# Patient Record
Sex: Male | Born: 1965
Health system: Southern US, Community
[De-identification: ages and names within clinical notes are randomized; demographics above are authoritative.]

## PROBLEM LIST (undated history)

## (undated) DIAGNOSIS — N200 Calculus of kidney: Secondary | ICD-10-CM

## (undated) DIAGNOSIS — Z0000 Encounter for general adult medical examination without abnormal findings: Secondary | ICD-10-CM

## (undated) DIAGNOSIS — Z87442 Personal history of urinary calculi: Secondary | ICD-10-CM

## (undated) HISTORY — DX: Encounter for general adult medical examination without abnormal findings: Z00.00

## (undated) HISTORY — DX: Calculus of kidney: N20.0

---

## 2017-01-11 DIAGNOSIS — L304 Erythema intertrigo: Secondary | ICD-10-CM | POA: Diagnosis not present

## 2017-03-29 HISTORY — PX: VASECTOMY: SHX75

## 2017-04-28 DIAGNOSIS — Z9852 Vasectomy status: Secondary | ICD-10-CM | POA: Insufficient documentation

## 2017-06-03 DIAGNOSIS — M778 Other enthesopathies, not elsewhere classified: Secondary | ICD-10-CM | POA: Diagnosis not present

## 2017-06-03 DIAGNOSIS — M7522 Bicipital tendinitis, left shoulder: Secondary | ICD-10-CM | POA: Diagnosis not present

## 2017-06-03 DIAGNOSIS — M7552 Bursitis of left shoulder: Secondary | ICD-10-CM | POA: Diagnosis not present

## 2017-06-10 DIAGNOSIS — Z48816 Encounter for surgical aftercare following surgery on the genitourinary system: Secondary | ICD-10-CM | POA: Diagnosis not present

## 2017-06-12 DIAGNOSIS — M25512 Pain in left shoulder: Secondary | ICD-10-CM | POA: Diagnosis not present

## 2017-06-19 DIAGNOSIS — M25512 Pain in left shoulder: Secondary | ICD-10-CM | POA: Diagnosis not present

## 2017-06-28 DIAGNOSIS — M25512 Pain in left shoulder: Secondary | ICD-10-CM | POA: Diagnosis not present

## 2017-07-12 DIAGNOSIS — M25512 Pain in left shoulder: Secondary | ICD-10-CM | POA: Diagnosis not present

## 2017-07-29 DIAGNOSIS — M7522 Bicipital tendinitis, left shoulder: Secondary | ICD-10-CM | POA: Diagnosis not present

## 2017-07-29 DIAGNOSIS — M7552 Bursitis of left shoulder: Secondary | ICD-10-CM | POA: Diagnosis not present

## 2017-07-29 DIAGNOSIS — Z0189 Encounter for other specified special examinations: Secondary | ICD-10-CM | POA: Diagnosis not present

## 2017-08-19 DIAGNOSIS — M7522 Bicipital tendinitis, left shoulder: Secondary | ICD-10-CM | POA: Diagnosis not present

## 2017-08-26 DIAGNOSIS — S43432A Superior glenoid labrum lesion of left shoulder, initial encounter: Secondary | ICD-10-CM | POA: Diagnosis not present

## 2017-08-26 DIAGNOSIS — M75112 Incomplete rotator cuff tear or rupture of left shoulder, not specified as traumatic: Secondary | ICD-10-CM | POA: Diagnosis not present

## 2017-09-23 DIAGNOSIS — R319 Hematuria, unspecified: Secondary | ICD-10-CM | POA: Diagnosis not present

## 2017-09-23 DIAGNOSIS — R3915 Urgency of urination: Secondary | ICD-10-CM | POA: Diagnosis not present

## 2017-09-25 ENCOUNTER — Other Ambulatory Visit: Payer: Self-pay

## 2017-10-03 ENCOUNTER — Ambulatory Visit
Admission: RE | Admit: 2017-10-03 | Discharge: 2017-10-03 | Disposition: A | Discharge status: Home / Self Care | Payer: 59 | Source: Ambulatory Visit | Attending: Urology | Admitting: Urology

## 2017-10-03 ENCOUNTER — Ambulatory Visit: Payer: 59 | Admitting: Urology

## 2017-10-03 ENCOUNTER — Encounter: Payer: Self-pay | Admitting: Urology

## 2017-10-03 VITALS — BP 129/87 | HR 62 | Ht 70.0 in | Wt 171.4 lb

## 2017-10-03 DIAGNOSIS — R319 Hematuria, unspecified: Secondary | ICD-10-CM | POA: Diagnosis not present

## 2017-10-03 DIAGNOSIS — R3129 Other microscopic hematuria: Secondary | ICD-10-CM | POA: Insufficient documentation

## 2017-10-03 DIAGNOSIS — N23 Unspecified renal colic: Secondary | ICD-10-CM

## 2017-10-03 MED ORDER — TAMSULOSIN HCL 0.4 MG PO CAPS: 0.4000 mg | ORAL_CAPSULE | Freq: Every day | ORAL | 0 refills | Status: DC

## 2017-10-03 NOTE — Progress Notes (Signed)
10/03/2017 11:51 AM   Meta Hatchet 10-03-1966 [id number]  Referring provider: Luna Glasgow, DO [facility] [place], [place] [address]  Chief Complaint  Patient presents with  . Transferring Care    HPI: 51 y.o. male presents for evaluation of renal colic.  I saw him at [facility] earlier this year for a vasectomy.  He had onset of left flank pain approximately 2 weeks ago.  The first episode lasted only 3-4 minutes then resolved.  He had a second episode which lasted approximately 4 hours.  The pain was nonradiating and isolated to the left flank region.  There were no identifiable precipitating, aggravating or alleviating factors.  Severity was rated moderate.  It was associated with urinary urgency.  He was seen [facility] on 11/26 and urinalysis showed 11-50 RBCs.  He was started empirically on Cipro however his urine culture was negative.  His flank pain has resolved however he continues to have intermittent urgency and voiding small amounts.   PMH: Past Medical History:  Diagnosis Date  . Healthy adult on routine physical examination   . Kidney stone     Surgical History: Past Surgical History:  Procedure Laterality Date  . VASECTOMY  03/2017   Dr. Bernardo Heater    Home Medications:  Allergies as of 10/03/2017   No Known Allergies     Medication List        Accurate as of 10/03/17 11:51 AM. Always use your most recent med list.          DOCOSAHEXAENOIC ACID PO Take 1 capsule by mouth daily.   ibuprofen 400 MG tablet Commonly known as:  ADVIL,MOTRIN Take 400 mg by mouth daily as needed.   meloxicam 15 MG tablet Commonly known as:  MOBIC Take 15 mg by mouth daily as needed for pain.       Allergies: No Known Allergies  Family History: Family History  Problem Relation Age of Onset  . Lung cancer Father   . Other Mother        Pre-diabetic  . Kidney Stones Mother   . Kidney Stones Brother   . Skin cancer  Brother        All has been removed- Not Melanoma    Social History:  reports that  has never smoked. he has never used smokeless tobacco. He reports that he drinks alcohol. He reports that he does not use drugs.  ROS: UROLOGY Frequent Urination?: Yes Hard to postpone urination?: No Burning/pain with urination?: No Get up at night to urinate?: No Leakage of urine?: No Urine stream starts and stops?: No Trouble starting stream?: No Do you have to strain to urinate?: No Blood in urine?: No Urinary tract infection?: No Sexually transmitted disease?: No Injury to kidneys or bladder?: No Painful intercourse?: No Weak stream?: No Erection problems?: No Penile pain?: No Currently pregnant?: No Vaginal bleeding?: No Last menstrual period?: N/A  Gastrointestinal Nausea?: No Vomiting?: No Indigestion/heartburn?: No Diarrhea?: No Constipation?: No  Constitutional Fever: No Night sweats?: No Weight loss?: No Fatigue?: No  Skin Skin rash/lesions?: No Itching?: No  Eyes Blurred vision?: No Double vision?: No  Ears/Nose/Throat Sore throat?: No Sinus problems?: No  Hematologic/Lymphatic Swollen glands?: No Easy bruising?: No  Cardiovascular Leg swelling?: No Chest pain?: No  Respiratory Cough?: No Shortness of breath?: No  Endocrine Excessive thirst?: No  Musculoskeletal Back pain?: No Joint pain?: No  Neurological Headaches?: No Dizziness?: No  Psychologic Depression?: No Anxiety?: No  Physical Exam: BP 129/87   Pulse 62   Ht 5\' 10"  (1.778 m)   Wt 171 lb 6.4 oz (77.7 kg)   BMI 24.59 kg/m   Constitutional:  Alert and oriented, No acute distress. HEENT: [place] AT, moist mucus membranes.  Trachea midline, no masses. Cardiovascular: No clubbing, cyanosis, or edema. Respiratory: Normal respiratory effort, no increased work of breathing. GI: Abdomen is soft, nontender, nondistended, no abdominal masses GU: No CVA tenderness.  Skin: No rashes,  bruises or suspicious lesions. Lymph: No cervical or inguinal adenopathy. Neurologic: Grossly intact, no focal deficits, moving all 4 extremities. Psychiatric: Normal mood and affect.   Assessment & Plan:  Symptoms suspicious for a distal ureteral calculus.  He was sent for KUB to evaluate for an obvious stone.  Start tamsulosin 0.4 mg daily.  If KUB is unremarkable we will schedule a stone protocol CT of the abdomen and pelvis.  He was given a strainer.   1. Microhematuria   2. Renal colic     Abbie Sons, MD  [facility] [address], [place], [place] [address] [phone]

## 2017-10-04 ENCOUNTER — Other Ambulatory Visit: Payer: Self-pay | Admitting: Urology

## 2017-10-04 DIAGNOSIS — R3129 Other microscopic hematuria: Secondary | ICD-10-CM

## 2017-10-04 DIAGNOSIS — R109 Unspecified abdominal pain: Secondary | ICD-10-CM

## 2017-10-09 ENCOUNTER — Telehealth: Payer: Self-pay

## 2017-10-09 NOTE — Telephone Encounter (Signed)
Spoke with pt in reference to KUB results and needing a CT. Pt voiced understanding.

## 2017-10-09 NOTE — Telephone Encounter (Signed)
-----   Message from Abbie Sons, MD sent at 10/04/2017  8:55 AM EST ----- KUB reviewed and there may be a small stone in the left distal ureter and the right kidney however there is a large amount of overlying stool and bowel gas which obscures the images.  Recommend scheduling stone protocol CT.  Order was entered.

## 2017-10-23 ENCOUNTER — Ambulatory Visit
Admission: RE | Admit: 2017-10-23 | Discharge: 2017-10-23 | Disposition: A | Discharge status: Home / Self Care | Payer: 59 | Source: Ambulatory Visit | Attending: Urology | Admitting: Urology

## 2017-10-23 DIAGNOSIS — N201 Calculus of ureter: Secondary | ICD-10-CM | POA: Diagnosis not present

## 2017-10-23 DIAGNOSIS — R3129 Other microscopic hematuria: Secondary | ICD-10-CM | POA: Diagnosis present

## 2017-10-23 DIAGNOSIS — N200 Calculus of kidney: Secondary | ICD-10-CM | POA: Diagnosis not present

## 2017-10-23 DIAGNOSIS — R109 Unspecified abdominal pain: Secondary | ICD-10-CM | POA: Diagnosis not present

## 2017-10-24 ENCOUNTER — Telehealth: Payer: Self-pay

## 2017-10-24 NOTE — Telephone Encounter (Signed)
-----   Message from Abbie Sons, MD sent at 10/24/2017  8:12 AM EST ----- CT does show a 5 mm left distal ureteral calculus.  If he is unable to pass ureteroscopy would be the best treatment option.

## 2017-10-24 NOTE — Telephone Encounter (Signed)
Left pt mess to call 

## 2017-10-24 NOTE — Telephone Encounter (Signed)
Patient notified/SW 

## 2017-11-08 ENCOUNTER — Telehealth: Payer: Self-pay | Admitting: Urology

## 2017-11-08 NOTE — Telephone Encounter (Signed)
Pt needs a refill on Tamsulosin .4 called into [facility] in [place].  Please call pt to confirm [phone]  He is completely out.

## 2017-11-11 ENCOUNTER — Other Ambulatory Visit: Payer: Self-pay | Admitting: Urology

## 2017-11-11 MED ORDER — TAMSULOSIN HCL 0.4 MG PO CAPS: 0.4000 mg | ORAL_CAPSULE | Freq: Every day | ORAL | 0 refills | Status: DC

## 2017-11-11 NOTE — Telephone Encounter (Signed)
Pt called office [facility] stating that he has called office several times asking for refill of his medication.  Has called both pharmacies of which both have told him they have not received anything from our office. Pt states he is out of medication and would like for someone to call hm in a refill and call the patient at [phone]. Please advise.

## 2017-11-15 ENCOUNTER — Telehealth: Payer: Self-pay | Admitting: Urology

## 2017-11-15 MED ORDER — TAMSULOSIN HCL 0.4 MG PO CAPS: 0.4000 mg | ORAL_CAPSULE | Freq: Every day | ORAL | 0 refills | Status: DC

## 2017-11-15 NOTE — Telephone Encounter (Signed)
Medication sent to [facility]

## 2017-11-15 NOTE — Telephone Encounter (Signed)
Pt has been notified  Sharyn Lull

## 2017-11-15 NOTE — Telephone Encounter (Signed)
Patient called and stated that he gave [place] the wrong pharmacy he wants his Tamsulosin called into the [facility] not [facility] Can we please change it and call it in to the correct place?  Sharyn Lull

## 2017-11-30 ENCOUNTER — Encounter (INDEPENDENT_AMBULATORY_CARE_PROVIDER_SITE_OTHER): Payer: Self-pay

## 2017-12-06 ENCOUNTER — Other Ambulatory Visit: Payer: Self-pay | Admitting: Urology

## 2017-12-06 DIAGNOSIS — N201 Calculus of ureter: Secondary | ICD-10-CM

## 2017-12-06 NOTE — Telephone Encounter (Signed)
Made pt follow up appt to see you this coming Monday morning 12/09/2017 @9a , he needs xray orders placed please. He will get xray 8am Monday morning before appt. Please advise. Thank you.

## 2017-12-09 ENCOUNTER — Ambulatory Visit
Admission: RE | Admit: 2017-12-09 | Discharge: 2017-12-09 | Disposition: A | Discharge status: Home / Self Care | Payer: 59 | Source: Ambulatory Visit | Attending: Urology | Admitting: Urology

## 2017-12-09 ENCOUNTER — Encounter: Payer: Self-pay | Admitting: Urology

## 2017-12-09 ENCOUNTER — Ambulatory Visit (INDEPENDENT_AMBULATORY_CARE_PROVIDER_SITE_OTHER): Payer: 59 | Admitting: Urology

## 2017-12-09 VITALS — BP 127/76 | HR 63 | Ht 70.0 in | Wt 167.0 lb

## 2017-12-09 DIAGNOSIS — R935 Abnormal findings on diagnostic imaging of other abdominal regions, including retroperitoneum: Secondary | ICD-10-CM | POA: Diagnosis not present

## 2017-12-09 DIAGNOSIS — N201 Calculus of ureter: Secondary | ICD-10-CM

## 2017-12-09 DIAGNOSIS — N200 Calculus of kidney: Secondary | ICD-10-CM | POA: Diagnosis not present

## 2017-12-09 NOTE — Progress Notes (Signed)
12/09/2017 9:46 AM   Meta Hatchet 1966/09/15 [id number]  Referring provider: Katheren Shams [address] [place], [place] [address]  Chief Complaint  Patient presents with  . Flank Pain    HPI: 52 year old male presents for follow-up of a ureteral calculus.  He was seen on 10/03/2017 with a 2-week history of intermittent left flank pain and urinary frequency and urgency associated with microhematuria.  A stone protocol CT of the abdomen and pelvis was remarkable for a 5 mm nonobstructing left distal ureteral calculus and 4 nonobstructing left renal calculi each measuring approximately 2 mm.  He contacted me last week stating he was having intermittent symptoms and is not aware of passing a stone.  A follow-up visit with KUB was recommended.  KUB performed today was reviewed and the calculus is visualized in the left true bony pelvis consistent with a distal stone.  PMH: Past Medical History:  Diagnosis Date  . Healthy adult on routine physical examination   . Kidney stone     Surgical History: Past Surgical History:  Procedure Laterality Date  . VASECTOMY  03/2017   Dr. Bernardo Heater    Home Medications:  Allergies as of 12/09/2017   No Known Allergies     Medication List        Accurate as of 12/09/17  9:46 AM. Always use your most recent med list.          DOCOSAHEXAENOIC ACID PO Take 1 capsule by mouth daily.   ibuprofen 400 MG tablet Commonly known as:  ADVIL,MOTRIN Take 400 mg by mouth daily as needed.   meloxicam 15 MG tablet Commonly known as:  MOBIC Take 15 mg by mouth daily as needed for pain.   tamsulosin 0.4 MG Caps capsule Commonly known as:  FLOMAX Take 1 capsule (0.4 mg total) by mouth daily.       Allergies: No Known Allergies  Family History: Family History  Problem Relation Age of Onset  . Lung cancer Father   . Other Mother        Pre-diabetic  . Kidney Stones Mother   . Kidney Stones Brother   . Skin cancer  Brother        All has been removed- Not Melanoma    Social History:  reports that  has never smoked. he has never used smokeless tobacco. He reports that he drinks alcohol. He reports that he does not use drugs.  ROS: UROLOGY Frequent Urination?: No Hard to postpone urination?: No Burning/pain with urination?: No Get up at night to urinate?: No Leakage of urine?: No Urine stream starts and stops?: No Trouble starting stream?: No Do you have to strain to urinate?: No Blood in urine?: No Urinary tract infection?: No Sexually transmitted disease?: No Injury to kidneys or bladder?: No Painful intercourse?: No Weak stream?: No Erection problems?: No Penile pain?: No  Gastrointestinal Nausea?: No Vomiting?: No Indigestion/heartburn?: No Diarrhea?: No Constipation?: No  Constitutional Fever: No Night sweats?: No Weight loss?: No Fatigue?: No  Skin Skin rash/lesions?: No Itching?: No  Eyes Blurred vision?: No Double vision?: No  Ears/Nose/Throat Sore throat?: No Sinus problems?: No  Hematologic/Lymphatic Swollen glands?: No Easy bruising?: No  Cardiovascular Leg swelling?: No Chest pain?: No  Respiratory Cough?: No Shortness of breath?: No  Endocrine Excessive thirst?: No  Musculoskeletal Back pain?: No Joint pain?: No  Neurological Headaches?: No Dizziness?: No  Psychologic Depression?: No Anxiety?: No  Physical Exam: BP 127/76   Pulse 63   Ht  5\' 10"  (1.778 m)   Wt 167 lb (75.8 kg)   BMI 23.96 kg/m   Constitutional:  Alert and oriented, No acute distress. HEENT: [place] AT, moist mucus membranes.  Trachea midline, no masses. Cardiovascular: No clubbing, cyanosis, or edema.  CV RRR Respiratory: Normal respiratory effort, no increased work of breathing.  Lungs clear GI: Abdomen is soft, nontender, nondistended, no abdominal masses GU: No CVA tenderness.  Skin: No rashes, bruises or suspicious lesions. Lymph: No cervical or inguinal  adenopathy. Neurologic: Grossly intact, no focal deficits, moving all 4 extremities. Psychiatric: Normal mood and affect.  Laboratory Data:  Pertinent Imaging: Images were personally reviewed  Results for orders placed during the hospital encounter of 12/09/17  Abdomen 1 view (KUB)   Narrative CLINICAL DATA:  Follow-up kidney stones  EXAM: ABDOMEN - 1 VIEW  COMPARISON:  CT urogram of October 23, 2017 and KUB of October 03, 2017.  FINDINGS: No abnormal calcifications project over either kidney. There are calcifications in the left aspect of the true bony pelvis which likely reflect phleboliths. A approximately 2 x 5 mm calcification projects in the region of the left UVJ and could reflect a recurrent or residual stone. The lung bases are clear. The bony structures are unremarkable.  IMPRESSION: Possible left UVJ stone.  No stones observed elsewhere.   Electronically Signed   By: David  Martinique M.D.   On: 12/09/2017 08:31     Results for orders placed during the hospital encounter of 10/23/17  CT RENAL STONE STUDY   Narrative CLINICAL DATA:  Left flank pain for the last several weeks intermittently. Microhematuria  EXAM: CT ABDOMEN AND PELVIS WITHOUT CONTRAST  TECHNIQUE: Multidetector CT imaging of the abdomen and pelvis was performed following the standard protocol without IV contrast.  COMPARISON:  Radiographs from 10/03/2017  FINDINGS: Lower chest: Unremarkable  Hepatobiliary: Contracted gallbladder. 4 mm hypodense lesion in the left hepatic lobe on image 15/2.  Pancreas: Unremarkable  Spleen: Unremarkable  Adrenals/Urinary Tract: Adrenal glands normal. Non rotated right kidney with suspicion for at least partially duplicated collecting system. I do not see a right-sided renal calculus. However, there is a 5 mm in long axis left distal ureteral calculus without associated significant hydronephrosis or hydroureter. In addition, there are proximally 4  tiny nonobstructive left renal calculi each about 2 mm in diameter.  Stomach/Bowel: Unremarkable  Vascular/Lymphatic: Unremarkable  Reproductive: Unremarkable  Other: No supplemental non-categorized findings.  Musculoskeletal: Fatty left spermatic cord.  IMPRESSION: 1. 5 mm in long axis currently nonobstructive left distal ureteral calculus just proximal to the left UVJ. 2. There are approximately 4 tiny nonobstructive additional left renal calculi. 3. Non rotated right kidney with suspicion for at least partially duplicated right renal collecting system.   Electronically Signed   By: Van Clines M.D.   On: 10/23/2017 16:58     Assessment & Plan:   52 year old male with a 5 mm left distal ureteral calculus.  Although minimally symptomatic he has been unable to pass the stone and was informed it is unlikely will pass spontaneously since it is been over 2 months.  Management options were discussed including ureteroscopic removal and shockwave lithotripsy.  The pros and cons of each treatment were discussed.  He would like to think over these options and states he will call back with his decision.  1. Left ureteral calculus   Abbie Sons, MD  [facility] [address], [place], [place] [address] [phone]

## 2017-12-09 NOTE — H&P (View-Only) (Signed)
12/09/2017 9:46 AM   Edwin Day [birth year] [id number]  Referring provider: Katheren Shams [address] [place], [place] [address]  Chief Complaint  Patient presents with  . Flank Pain    HPI: 52 year old male presents for follow-up of a ureteral calculus.  He was seen on 10/03/2017 with a 2-week history of intermittent left flank pain and urinary frequency and urgency associated with microhematuria.  A stone protocol CT of the abdomen and pelvis was remarkable for a 5 mm nonobstructing left distal ureteral calculus and 4 nonobstructing left renal calculi each measuring approximately 2 mm.  He contacted me last week stating he was having intermittent symptoms and is not aware of passing a stone.  A follow-up visit with KUB was recommended.  KUB performed today was reviewed and the calculus is visualized in the left true bony pelvis consistent with a distal stone.  PMH: Past Medical History:  Diagnosis Date  . Healthy adult on routine physical examination   . Kidney stone     Surgical History: Past Surgical History:  Procedure Laterality Date  . VASECTOMY  03/2017   Dr. Bernardo Heater    Home Medications:  Allergies as of 12/09/2017   No Known Allergies     Medication List        Accurate as of 12/09/17  9:46 AM. Always use your most recent med list.          DOCOSAHEXAENOIC ACID PO Take 1 capsule by mouth daily.   ibuprofen 400 MG tablet Commonly known as:  ADVIL,MOTRIN Take 400 mg by mouth daily as needed.   meloxicam 15 MG tablet Commonly known as:  MOBIC Take 15 mg by mouth daily as needed for pain.   tamsulosin 0.4 MG Caps capsule Commonly known as:  FLOMAX Take 1 capsule (0.4 mg total) by mouth daily.       Allergies: No Known Allergies  Family History: Family History  Problem Relation Age of Onset  . Lung cancer Father   . Other Mother        Pre-diabetic  . Kidney Stones Mother   . Kidney Stones Brother   . Skin cancer  Brother        All has been removed- Not Melanoma    Social History:  reports that  has never smoked. he has never used smokeless tobacco. He reports that he drinks alcohol. He reports that he does not use drugs.  ROS: UROLOGY Frequent Urination?: No Hard to postpone urination?: No Burning/pain with urination?: No Get up at night to urinate?: No Leakage of urine?: No Urine stream starts and stops?: No Trouble starting stream?: No Do you have to strain to urinate?: No Blood in urine?: No Urinary tract infection?: No Sexually transmitted disease?: No Injury to kidneys or bladder?: No Painful intercourse?: No Weak stream?: No Erection problems?: No Penile pain?: No  Gastrointestinal Nausea?: No Vomiting?: No Indigestion/heartburn?: No Diarrhea?: No Constipation?: No  Constitutional Fever: No Night sweats?: No Weight loss?: No Fatigue?: No  Skin Skin rash/lesions?: No Itching?: No  Eyes Blurred vision?: No Double vision?: No  Ears/Nose/Throat Sore throat?: No Sinus problems?: No  Hematologic/Lymphatic Swollen glands?: No Easy bruising?: No  Cardiovascular Leg swelling?: No Chest pain?: No  Respiratory Cough?: No Shortness of breath?: No  Endocrine Excessive thirst?: No  Musculoskeletal Back pain?: No Joint pain?: No  Neurological Headaches?: No Dizziness?: No  Psychologic Depression?: No Anxiety?: No  Physical Exam: BP 127/76   Pulse 63   Ht  5\' 10"  (1.778 m)   Wt 167 lb (75.8 kg)   BMI 23.96 kg/m   Constitutional:  Alert and oriented, No acute distress. HEENT: [place] AT, moist mucus membranes.  Trachea midline, no masses. Cardiovascular: No clubbing, cyanosis, or edema.  CV RRR Respiratory: Normal respiratory effort, no increased work of breathing.  Lungs clear GI: Abdomen is soft, nontender, nondistended, no abdominal masses GU: No CVA tenderness.  Skin: No rashes, bruises or suspicious lesions. Lymph: No cervical or inguinal  adenopathy. Neurologic: Grossly intact, no focal deficits, moving all 4 extremities. Psychiatric: Normal mood and affect.  Laboratory Data:  Pertinent Imaging: Images were personally reviewed  Results for orders placed during the hospital encounter of 12/09/17  Abdomen 1 view (KUB)   Narrative CLINICAL DATA:  Follow-up kidney stones  EXAM: ABDOMEN - 1 VIEW  COMPARISON:  CT urogram of October 23, 2017 and KUB of October 03, 2017.  FINDINGS: No abnormal calcifications project over either kidney. There are calcifications in the left aspect of the true bony pelvis which likely reflect phleboliths. A approximately 2 x 5 mm calcification projects in the region of the left UVJ and could reflect a recurrent or residual stone. The lung bases are clear. The bony structures are unremarkable.  IMPRESSION: Possible left UVJ stone.  No stones observed elsewhere.   Electronically Signed   By: David  Martinique M.D.   On: 12/09/2017 08:31     Results for orders placed during the hospital encounter of 10/23/17  CT RENAL STONE STUDY   Narrative CLINICAL DATA:  Left flank pain for the last several weeks intermittently. Microhematuria  EXAM: CT ABDOMEN AND PELVIS WITHOUT CONTRAST  TECHNIQUE: Multidetector CT imaging of the abdomen and pelvis was performed following the standard protocol without IV contrast.  COMPARISON:  Radiographs from 10/03/2017  FINDINGS: Lower chest: Unremarkable  Hepatobiliary: Contracted gallbladder. 4 mm hypodense lesion in the left hepatic lobe on image 15/2.  Pancreas: Unremarkable  Spleen: Unremarkable  Adrenals/Urinary Tract: Adrenal glands normal. Non rotated right kidney with suspicion for at least partially duplicated collecting system. I do not see a right-sided renal calculus. However, there is a 5 mm in long axis left distal ureteral calculus without associated significant hydronephrosis or hydroureter. In addition, there are proximally 4  tiny nonobstructive left renal calculi each about 2 mm in diameter.  Stomach/Bowel: Unremarkable  Vascular/Lymphatic: Unremarkable  Reproductive: Unremarkable  Other: No supplemental non-categorized findings.  Musculoskeletal: Fatty left spermatic cord.  IMPRESSION: 1. 5 mm in long axis currently nonobstructive left distal ureteral calculus just proximal to the left UVJ. 2. There are approximately 4 tiny nonobstructive additional left renal calculi. 3. Non rotated right kidney with suspicion for at least partially duplicated right renal collecting system.   Electronically Signed   By: Van Clines M.D.   On: 10/23/2017 16:58     Assessment & Plan:   52 year old male with a 5 mm left distal ureteral calculus.  Although minimally symptomatic he has been unable to pass the stone and was informed it is unlikely will pass spontaneously since it is been over 2 months.  Management options were discussed including ureteroscopic removal and shockwave lithotripsy.  The pros and cons of each treatment were discussed.  He would like to think over these options and states he will call back with his decision.  1. Left ureteral calculus   Abbie Sons, MD  [facility] [address], [place],  [address] [phone]

## 2017-12-10 ENCOUNTER — Encounter: Payer: Self-pay | Admitting: Urology

## 2017-12-17 ENCOUNTER — Telehealth: Payer: Self-pay | Admitting: Radiology

## 2017-12-17 ENCOUNTER — Encounter (INDEPENDENT_AMBULATORY_CARE_PROVIDER_SITE_OTHER): Payer: Self-pay

## 2017-12-17 NOTE — Telephone Encounter (Signed)
Pt was seen on 12/09/2017 & states he would like to proceed with URS with Dr Bernardo Heater. Please advise.

## 2017-12-18 ENCOUNTER — Telehealth: Payer: Self-pay

## 2017-12-18 NOTE — Telephone Encounter (Signed)
Pt has NKDA. Which abx for surgery?

## 2017-12-18 NOTE — Telephone Encounter (Signed)
Can go ahead and schedule left URS with holmium and stent placement.  Pre op urine cx.

## 2017-12-18 NOTE — Telephone Encounter (Signed)
Pt sent a mychart message voicing concern about the other kidney stones he has. Pt inquired about what should be done about them. Pt is scheduled for surgery in early March for surgery.

## 2017-12-22 NOTE — Telephone Encounter (Signed)
Ancef 2 g

## 2017-12-22 NOTE — Telephone Encounter (Signed)
I can attempt to remove these at the time of his ureteroscopy however there would be a higher complication rate and he would need a stent for a longer period of time.  These are all very small and should easily pass if they have removed.  Can discuss this with him further if desired.

## 2017-12-23 ENCOUNTER — Other Ambulatory Visit: Payer: Self-pay | Admitting: Radiology

## 2017-12-23 DIAGNOSIS — N201 Calculus of ureter: Secondary | ICD-10-CM

## 2017-12-23 NOTE — Telephone Encounter (Signed)
Called to notify pt of surgery date, pre-admit testing appt & f/u appt with Dr Bernardo Heater. Advised pt of Dr Dene Gentry message below regarding other kidney stones. Pt prefers to wait until f/u appt to discuss this further. Instructions given. Pt voices understanding with no further questions at this time.

## 2017-12-25 ENCOUNTER — Ambulatory Visit: Payer: 59 | Admitting: Urology

## 2017-12-27 ENCOUNTER — Encounter (INDEPENDENT_AMBULATORY_CARE_PROVIDER_SITE_OTHER): Payer: Self-pay

## 2017-12-27 ENCOUNTER — Encounter
Admission: RE | Admit: 2017-12-27 | Discharge: 2017-12-27 | Disposition: A | Discharge status: Home / Self Care | Payer: 59 | Source: Ambulatory Visit | Attending: Urology | Admitting: Urology

## 2017-12-27 ENCOUNTER — Other Ambulatory Visit: Payer: Self-pay

## 2017-12-27 DIAGNOSIS — Z01812 Encounter for preprocedural laboratory examination: Secondary | ICD-10-CM | POA: Diagnosis present

## 2017-12-27 HISTORY — DX: Personal history of urinary calculi: Z87.442

## 2017-12-27 NOTE — Patient Instructions (Signed)
Your procedure is scheduled on:  Friday 01/03/18 Report to [place]. To find out your arrival time please call [phone] between 1PM - 3PM on Thursday 01/02/18.  Remember: Instructions that are not followed completely may result in serious medical risk, up to and including death, or upon the discretion of your surgeon and anesthesiologist your surgery may need to be rescheduled.     _X__ 1. Do not eat food after midnight the night before your procedure.                 No gum chewing or hard candies. You may drink clear liquids up to 2 hours                 before you are scheduled to arrive for your surgery- DO not drink clear                 liquids within 2 hours of the start of your surgery.                 Clear Liquids include:  water, apple juice without pulp, clear carbohydrate                 drink such as Clearfast or Gatorade, Black Coffee or Tea (Do not add                 anything to coffee or tea).  __X__2.  On the morning of surgery brush your teeth with toothpaste and water, you                 may rinse your mouth with mouthwash if you wish.  Do not swallow any              toothpaste of mouthwash.     _X__ 3.  No Alcohol for 24 hours before or after surgery.   _X__ 4.  Do Not Smoke or use e-cigarettes For 24 Hours Prior to Your Surgery.                 Do not use any chewable tobacco products for at least 6 hours prior to                 surgery.  ____  5.  Bring all medications with you on the day of surgery if instructed.   __X__  6.  Notify your doctor if there is any change in your medical condition      (cold, fever, infections).     Do not wear jewelry, make-up, hairpins, clips or nail polish. Do not wear lotions, powders, or perfumes.  Do not shave 48 hours prior to surgery. Men may shave face and neck. Do not bring valuables to the hospital.    [facility] is not responsible for any belongings or  valuables.  Contacts, dentures/partials or body piercings may not be worn into surgery. Bring a case for your contacts, glasses or hearing aids, a denture cup will be supplied. Leave your suitcase in the car. After surgery it may be brought to your room. For patients admitted to the hospital, discharge time is determined by your treatment team.   Patients discharged the day of surgery will not be allowed to drive home.   Please read over the following fact sheets that you were given:   MRSA Information  __X__ Take these medicines the morning of surgery with A SIP OF WATER:  1. none  2.   3.   4.  5.  6.  ____ Fleet Enema (as directed)   ____ Use CHG Soap/SAGE wipes as directed  ____ Use inhalers on the day of surgery  ____ Stop metformin/Janumet/Farxiga 2 days prior to surgery    ____ Take 1/2 of usual insulin dose the night before surgery. No insulin the morning          of surgery.   ____ Stop Blood Thinners Coumadin/Plavix/Xarelto/Pleta/Pradaxa/Eliquis/Effient/Aspirin  on    Or contact your Surgeon, Cardiologist or Medical Doctor regarding  ability to stop your blood thinners  __X__ Stop Anti-inflammatories 7 days before surgery such as Advil, Ibuprofen, Motrin,  BC or Goodies Powder, Naprosyn, Naproxen, Aleve, Aspirin  YOU MAY TAKE TYLENOL/ACETAMINOPHEN   __X__ Stopall herbal supplements, fish oil or vitamin E until after surgery.    ____ Bring C-Pap to the hospital.

## 2017-12-28 LAB — URINE CULTURE

## 2018-01-02 MED ORDER — CEFAZOLIN SODIUM-DEXTROSE 2-4 GM/100ML-% IV SOLN
2.0000 g | INTRAVENOUS | Status: AC
  Administered 2018-01-03: 2 g via INTRAVENOUS

## 2018-01-03 ENCOUNTER — Encounter
Admission: RE | Disposition: A | Discharge status: Home / Self Care | Payer: Self-pay | Source: Ambulatory Visit | Attending: Urology

## 2018-01-03 ENCOUNTER — Ambulatory Visit
Admission: RE | Admit: 2018-01-03 | Discharge: 2018-01-03 | Disposition: A | Discharge status: Home / Self Care | Payer: 59 | Source: Ambulatory Visit | Attending: Urology | Admitting: Urology

## 2018-01-03 ENCOUNTER — Encounter: Payer: Self-pay | Admitting: *Deleted

## 2018-01-03 ENCOUNTER — Ambulatory Visit: Payer: 59 | Admitting: Anesthesiology

## 2018-01-03 ENCOUNTER — Other Ambulatory Visit: Payer: Self-pay

## 2018-01-03 DIAGNOSIS — Z87442 Personal history of urinary calculi: Secondary | ICD-10-CM | POA: Diagnosis not present

## 2018-01-03 DIAGNOSIS — N201 Calculus of ureter: Secondary | ICD-10-CM | POA: Insufficient documentation

## 2018-01-03 HISTORY — PX: STONE EXTRACTION WITH BASKET: SHX5318

## 2018-01-03 HISTORY — PX: CYSTOSCOPY/URETEROSCOPY/HOLMIUM LASER/STENT PLACEMENT: SHX6546

## 2018-01-03 SURGERY — CYSTOSCOPY/URETEROSCOPY/HOLMIUM LASER/STENT PLACEMENT
Anesthesia: General | Laterality: Left | Wound class: Clean Contaminated

## 2018-01-03 MED ORDER — DEXAMETHASONE SODIUM PHOSPHATE 10 MG/ML IJ SOLN
INTRAMUSCULAR | Status: DC | PRN
  Administered 2018-01-03: 10 mg via INTRAVENOUS

## 2018-01-03 MED ORDER — HYDROCODONE-ACETAMINOPHEN 5-325 MG PO TABS
1.0000 | ORAL_TABLET | ORAL | Status: DC | PRN
  Administered 2018-01-03: 1 via ORAL

## 2018-01-03 MED ORDER — HYDROCODONE-ACETAMINOPHEN 5-325 MG PO TABS: 1.0000 | ORAL_TABLET | ORAL | 0 refills | Status: DC | PRN

## 2018-01-03 MED ORDER — FENTANYL CITRATE (PF) 100 MCG/2ML IJ SOLN
INTRAMUSCULAR | Status: DC | PRN
  Administered 2018-01-03 (×2): 25 ug via INTRAVENOUS
  Administered 2018-01-03: 50 ug via INTRAVENOUS

## 2018-01-03 MED ORDER — HYDROCODONE-ACETAMINOPHEN 5-325 MG PO TABS
ORAL_TABLET | ORAL | Status: AC
  Filled 2018-01-03: qty 1

## 2018-01-03 MED ORDER — FAMOTIDINE 20 MG PO TABS
ORAL_TABLET | ORAL | Status: AC
  Administered 2018-01-03: 20 mg via ORAL
  Filled 2018-01-03: qty 1

## 2018-01-03 MED ORDER — MIDAZOLAM HCL 2 MG/2ML IJ SOLN
INTRAMUSCULAR | Status: AC
Start: 2018-01-03 — End: ?
  Filled 2018-01-03: qty 2

## 2018-01-03 MED ORDER — MIDAZOLAM HCL 2 MG/2ML IJ SOLN
INTRAMUSCULAR | Status: DC | PRN
  Administered 2018-01-03: 2 mg via INTRAVENOUS

## 2018-01-03 MED ORDER — ONDANSETRON HCL 4 MG/2ML IJ SOLN: 4.0000 mg | Freq: Once | INTRAMUSCULAR | Status: DC | PRN

## 2018-01-03 MED ORDER — PHENYLEPHRINE HCL 10 MG/ML IJ SOLN
INTRAMUSCULAR | Status: DC | PRN
  Administered 2018-01-03: 100 ug via INTRAVENOUS

## 2018-01-03 MED ORDER — IOTHALAMATE MEGLUMINE 43 % IV SOLN
INTRAVENOUS | Status: DC | PRN
  Administered 2018-01-03: 15 mL

## 2018-01-03 MED ORDER — CEFAZOLIN SODIUM-DEXTROSE 2-4 GM/100ML-% IV SOLN
INTRAVENOUS | Status: AC
  Filled 2018-01-03: qty 100

## 2018-01-03 MED ORDER — FENTANYL CITRATE (PF) 100 MCG/2ML IJ SOLN: 25.0000 ug | INTRAMUSCULAR | Status: DC | PRN

## 2018-01-03 MED ORDER — ONDANSETRON HCL 4 MG/2ML IJ SOLN
INTRAMUSCULAR | Status: DC | PRN
  Administered 2018-01-03: 4 mg via INTRAVENOUS

## 2018-01-03 MED ORDER — LACTATED RINGERS IV SOLN
INTRAVENOUS | Status: DC
  Administered 2018-01-03: 09:00:00 via INTRAVENOUS

## 2018-01-03 MED ORDER — TAMSULOSIN HCL 0.4 MG PO CAPS: 0.4000 mg | ORAL_CAPSULE | Freq: Every day | ORAL | 0 refills | Status: AC

## 2018-01-03 MED ORDER — FAMOTIDINE 20 MG PO TABS
20.0000 mg | ORAL_TABLET | Freq: Once | ORAL | Status: AC
  Administered 2018-01-03: 20 mg via ORAL

## 2018-01-03 MED ORDER — FENTANYL CITRATE (PF) 100 MCG/2ML IJ SOLN
INTRAMUSCULAR | Status: AC
  Filled 2018-01-03: qty 2

## 2018-01-03 SURGICAL SUPPLY — 28 items
BAG DRAIN CYSTO-URO LG1000N (MISCELLANEOUS) ×3 IMPLANT
BASKET ZERO TIP 1.9FR (BASKET) ×3 IMPLANT
BRUSH SCRUB EZ 1% IODOPHOR (MISCELLANEOUS) ×3 IMPLANT
CATH URETL 5X70 OPEN END (CATHETERS) ×3 IMPLANT
CNTNR SPEC 2.5X3XGRAD LEK (MISCELLANEOUS) ×1
CONRAY 43 FOR UROLOGY 50M (MISCELLANEOUS) ×3 IMPLANT
CONT SPEC 4OZ STER OR WHT (MISCELLANEOUS) ×2
CONTAINER SPEC 2.5X3XGRAD LEK (MISCELLANEOUS) ×1 IMPLANT
DRAPE UTILITY 15X26 TOWEL STRL (DRAPES) ×3 IMPLANT
FIBER LASER LITHO 273 (Laser) IMPLANT
GLOVE BIO SURGEON STRL SZ8 (GLOVE) ×3 IMPLANT
GOWN STRL REUS W/ TWL LRG LVL3 (GOWN DISPOSABLE) ×2 IMPLANT
GOWN STRL REUS W/TWL LRG LVL3 (GOWN DISPOSABLE) ×4
GUIDEWIRE GREEN .038 145CM (MISCELLANEOUS) IMPLANT
INFUSOR MANOMETER BAG 3000ML (MISCELLANEOUS) ×3 IMPLANT
INTRODUCER DILATOR DOUBLE (INTRODUCER) IMPLANT
KIT TURNOVER CYSTO (KITS) ×3 IMPLANT
PACK CYSTO AR (MISCELLANEOUS) ×3 IMPLANT
SENSORWIRE 0.038 NOT ANGLED (WIRE) ×6
SET CYSTO W/LG BORE CLAMP LF (SET/KITS/TRAYS/PACK) ×3 IMPLANT
SHEATH URETERAL 12FRX35CM (MISCELLANEOUS) IMPLANT
SOL .9 NS 3000ML IRR  AL (IV SOLUTION) ×2
SOL .9 NS 3000ML IRR UROMATIC (IV SOLUTION) ×1 IMPLANT
STENT URET 6FRX24 CONTOUR (STENTS) IMPLANT
STENT URET 6FRX26 CONTOUR (STENTS) IMPLANT
SURGILUBE 2OZ TUBE FLIPTOP (MISCELLANEOUS) ×3 IMPLANT
WATER STERILE IRR 1000ML POUR (IV SOLUTION) ×3 IMPLANT
WIRE SENSOR 0.038 NOT ANGLED (WIRE) ×2 IMPLANT

## 2018-01-03 NOTE — Anesthesia Postprocedure Evaluation (Signed)
Anesthesia Post Note  Patient: Edwin Day  Procedure(s) Performed: CYSTOSCOPY/URETEROSCOPY (Left ) STONE EXTRACTION WITH BASKET (Left )  Patient location during evaluation: PACU Anesthesia Type: General Level of consciousness: awake and alert and oriented Pain management: pain level controlled Vital Signs Assessment: post-procedure vital signs reviewed and stable Respiratory status: spontaneous breathing Cardiovascular status: blood pressure returned to baseline Anesthetic complications: no     Last Vitals:  Vitals:   01/03/18 1050 01/03/18 1115  BP: 140/85 (!) 143/88  Pulse: (!) 55 (!) 57  Resp: 16   Temp: (!) 35.9 C   SpO2: 100%     Last Pain:  Vitals:   01/03/18 0754  TempSrc: Tympanic                 Thomasina Housley

## 2018-01-03 NOTE — Anesthesia Post-op Follow-up Note (Signed)
Anesthesia QCDR form completed.        

## 2018-01-03 NOTE — Transfer of Care (Signed)
Immediate Anesthesia Transfer of Care Note  Patient: Edwin Day  Procedure(s) Performed: CYSTOSCOPY/URETEROSCOPY (Left ) STONE EXTRACTION WITH BASKET (Left )  Patient Location: PACU  Anesthesia Type:General  Level of Consciousness: awake  Airway & Oxygen Therapy: Patient Spontanous Breathing  Post-op Assessment: Report given to RN  Post vital signs: stable  Last Vitals:  Vitals:   01/03/18 0754  BP: 132/85  Pulse: 63  Resp: 18  Temp: (!) 35.7 C  SpO2: 100%    Last Pain:  Vitals:   01/03/18 0754  TempSrc: Tympanic         Complications: No apparent anesthesia complications

## 2018-01-03 NOTE — Interval H&P Note (Signed)
History and Physical Interval Note:  01/03/2018 8:55 AM  Lyndee Hensen  has presented today for surgery, with the diagnosis of left ureteral calculus  The various methods of treatment have been discussed with the patient and family. After consideration of risks, benefits and other options for treatment, the patient has consented to  Procedure(s): CYSTOSCOPY/URETEROSCOPY/HOLMIUM LASER/STENT PLACEMENT (Left) as a surgical intervention .  The patient's history has been reviewed, patient examined, no change in status, stable for surgery.  I have reviewed the patient's chart and labs.  Questions were answered to the patient's satisfaction.     [place]

## 2018-01-03 NOTE — Op Note (Signed)
Preoperative diagnosis: Left distal ureteral calculus  Postoperative diagnosis: Left distal ureteral calculus  Procedure:  1. Cystoscopy 2. Left ureteroscopy and stone removal 3. Left retrograde pyelography with interpretation  Surgeon: Nicki Reaper C. Miyeko Mahlum, M.D.  Anesthesia: General  Complications: None  Intraoperative findings:  1.  Left retrograde pyelography post procedure showed no filling defects, stone fragments or contrast extravasation.  Without a guidewire there was excellent drainage of contrast noted on fluoroscopy.  EBL: Minimal  Specimens: 1. Calculus fragments for analysis   Indication: Edwin Day is a 52 y.o. year old patient with a 5 mm left distal ureteral calculus with intermittent frequency, urgency and flank pain.  The stone was noted in December 2018 and after discussing treatment options he has elected ureteroscopic removal.  After reviewing the management options for treatment, the patient elected to proceed with the above surgical procedure(s). We have discussed the potential benefits and risks of the procedure, side effects of the proposed treatment, the likelihood of the patient achieving the goals of the procedure, and any potential problems that might occur during the procedure or recuperation. Informed consent has been obtained.  Description of procedure:  The patient was taken to the operating room and general anesthesia was induced.  The patient was placed in the dorsal lithotomy position, prepped and draped in the usual sterile fashion, and preoperative antibiotics were administered. A preoperative time-out was performed.   A 22 French cystoscope was lubricated and passed under direct vision.  The urethra was normal in caliber without stricture.  The prostate demonstrated mild lateral lobe enlargement and moderate bladder neck elevation.  Panendoscopy was performed and the bladder mucosa showed no erythema, solid or papillary lesions.  Attention  was directed to the left ureteral orifice and a 0.038 Sensor wire was then advanced up the left ureter into the renal pelvis under fluoroscopic guidance.  A 4.5 Fr semirigid ureteroscope was then advanced into the ureter next to the guidewire and the calculus was identified in the distal ureter.  The stone was not impacted and free-floating in the distal ureter.  A 1.9 French 0 tip nitinol basket was placed to the ureteroscope and the stone was basketed and removed without difficulty.  The ureteroscope was repassed up to the UPJ.  No additional calculi were identified.  The guidewire was removed and a retrograde pyelogram was performed through the ureteroscope with findings as described above.  It was elected not to place a ureteral stent based on prompt drainage of contrast will be and atraumatic stone removal.  The bladder was then emptied and the procedure ended.  The patient appeared to tolerate the procedure well and without complications.  After anesthetic reversal the patient was transported to the PACU in stable condition.    Abbie Sons, MD

## 2018-01-03 NOTE — OR Nursing (Signed)
Discussed discharge instructions with pt and wife. Both voice understanding. 

## 2018-01-03 NOTE — Anesthesia Procedure Notes (Signed)
Procedure Name: Intubation Date/Time: 01/03/2018 9:29 AM Performed by: Leander Rams, CRNA Pre-anesthesia Checklist: Patient identified, Emergency Drugs available, Suction available, Patient being monitored and Timeout performed Patient Re-evaluated:Patient Re-evaluated prior to induction Oxygen Delivery Method: Circle system utilized Preoxygenation: Pre-oxygenation with 100% oxygen Induction Type: IV induction Ventilation: Mask ventilation without difficulty LMA: LMA inserted LMA Size: 4.5 Number of attempts: 2

## 2018-01-03 NOTE — Anesthesia Preprocedure Evaluation (Addendum)
Anesthesia Evaluation  Patient identified by MRN, date of birth, ID band Patient awake    Reviewed: Allergy & Precautions, NPO status , Patient's Chart, lab work & pertinent test results  Airway Mallampati: II  TM Distance: <3 FB     Dental  (+) Chipped   Pulmonary neg pulmonary ROS,    Pulmonary exam normal        Cardiovascular negative cardio ROS Normal cardiovascular exam     Neuro/Psych negative neurological ROS  negative psych ROS   GI/Hepatic negative GI ROS, Neg liver ROS,   Endo/Other  negative endocrine ROS  Renal/GU Renal disease  negative genitourinary   Musculoskeletal negative musculoskeletal ROS (+)   Abdominal Normal abdominal exam  (+)   Peds negative pediatric ROS (+)  Hematology negative hematology ROS (+)   Anesthesia Other Findings Past Medical History: No date: Healthy adult on routine physical examination No date: History of kidney stones No date: Kidney stone  Reproductive/Obstetrics                             Anesthesia Physical Anesthesia Plan  ASA: I  Anesthesia Plan: General   Post-op Pain Management:    Induction: Intravenous  PONV Risk Score and Plan:   Airway Management Planned: Oral ETT and LMA  Additional Equipment:   Intra-op Plan:   Post-operative Plan: Extubation in OR  Informed Consent: I have reviewed the patients History and Physical, chart, labs and discussed the procedure including the risks, benefits and alternatives for the proposed anesthesia with the patient or authorized representative who has indicated his/her understanding and acceptance.   Dental advisory given  Plan Discussed with: CRNA and Surgeon  Anesthesia Plan Comments:        Anesthesia Quick Evaluation

## 2018-01-07 LAB — STONE ANALYSIS
CA OXALATE, MONOHYDR.: 95 %
Ca phos cry stone ql IR: 5 %
Stone Weight KSTONE: 35.4 mg

## 2018-01-08 DIAGNOSIS — J019 Acute sinusitis, unspecified: Secondary | ICD-10-CM | POA: Diagnosis not present

## 2018-02-03 ENCOUNTER — Ambulatory Visit: Payer: 59 | Admitting: Urology

## 2018-02-13 ENCOUNTER — Ambulatory Visit: Payer: 59 | Admitting: Urology

## 2018-03-13 ENCOUNTER — Ambulatory Visit (INDEPENDENT_AMBULATORY_CARE_PROVIDER_SITE_OTHER): Payer: 59 | Admitting: Urology

## 2018-03-13 ENCOUNTER — Encounter: Payer: Self-pay | Admitting: Urology

## 2018-03-13 ENCOUNTER — Ambulatory Visit
Admission: RE | Admit: 2018-03-13 | Discharge: 2018-03-13 | Disposition: A | Discharge status: Home / Self Care | Payer: 59 | Source: Ambulatory Visit | Attending: Urology | Admitting: Urology

## 2018-03-13 VITALS — BP 116/79 | HR 66 | Resp 16 | Ht 69.0 in | Wt 173.2 lb

## 2018-03-13 DIAGNOSIS — N200 Calculus of kidney: Secondary | ICD-10-CM | POA: Diagnosis not present

## 2018-03-13 DIAGNOSIS — Z09 Encounter for follow-up examination after completed treatment for conditions other than malignant neoplasm: Secondary | ICD-10-CM | POA: Insufficient documentation

## 2018-03-13 DIAGNOSIS — Z87442 Personal history of urinary calculi: Secondary | ICD-10-CM | POA: Insufficient documentation

## 2018-03-13 DIAGNOSIS — N201 Calculus of ureter: Secondary | ICD-10-CM | POA: Diagnosis not present

## 2018-03-13 NOTE — Progress Notes (Signed)
03/13/2018 1:30 PM   Edwin Day 1966-02-04 [id number]  Referring provider: Katheren Shams [address] [place], [place] [address]  Chief Complaint  Patient presents with  . Routine Post Op    HPI: 52 year old male who underwent ureteroscopic removal of a 5 mm left distal ureteral calculus on 01/03/2018.  A stent was not placed postoperatively and he had no problems.  He does have small, nonobstructing left renal calculi measuring approximately 2 mm. Stone analysis was 95% calcium oxalate monohydrate and 5% calcium phosphate. KUB performed today does show moderate stool and bowel gas obscuring the renal outlines.   PMH: Past Medical History:  Diagnosis Date  . Healthy adult on routine physical examination   . History of kidney stones   . Kidney stone     Surgical History: Past Surgical History:  Procedure Laterality Date  . CYSTOSCOPY/URETEROSCOPY/HOLMIUM LASER/STENT PLACEMENT Left 01/03/2018   Procedure: CYSTOSCOPY/URETEROSCOPY;  Surgeon: Abbie Sons, MD;  Location: [facility] ORS;  Service: Urology;  Laterality: Left;  . STONE EXTRACTION WITH BASKET Left 01/03/2018   Procedure: STONE EXTRACTION WITH BASKET;  Surgeon: Abbie Sons, MD;  Location: [facility] OR[facility];  Service: Urology;  Laterality: Left;  [name] VASECTOMY  03/2017   Dr. Bernardo Heater    Home Medications:  Allergies as of 03/13/2018   No Known Allergies     Medication List        Accurate as of 03/13/18  1:30 PM. Always use your most recent med list.          acetaminophen 325 MG tablet Commonly known as:  TYLENOL Take 650 mg by mouth every 6 (six) hours as needed.   FISH OIL PO Take 1 capsule by mouth daily.   ibuprofen 200 MG tablet Commonly known as:  ADVIL,MOTRIN Take 400 mg by mouth every 8 (eight) hours as needed (for pain.).   multivitamin with minerals Tabs tablet Take 1 tablet by mouth daily.       Allergies: No Known Allergies  Family History: Family History  Problem  Relation Age of Onset  . Lung cancer Father   . Other Mother        Pre-diabetic  . Kidney Stones Mother   . Kidney Stones Brother   . Skin cancer Brother        All has been removed- Not Melanoma    Social History:  reports that he has never smoked. He has never used smokeless tobacco. He reports that he drinks alcohol. He reports that he does not use drugs.  ROS: UROLOGY Frequent Urination?: No Hard to postpone urination?: No Burning/pain with urination?: No Get up at night to urinate?: No Leakage of urine?: No Urine stream starts and stops?: No Trouble starting stream?: No Do you have to strain to urinate?: No Blood in urine?: No Urinary tract infection?: No Sexually transmitted disease?: No Injury to kidneys or bladder?: No Painful intercourse?: No Weak stream?: No Erection problems?: No Penile pain?: No  Gastrointestinal Nausea?: No Vomiting?: No Indigestion/heartburn?: No Diarrhea?: No Constipation?: No  Constitutional Fever: No Night sweats?: No Weight loss?: No Fatigue?: No  Skin Skin rash/lesions?: No Itching?: No  Eyes Blurred vision?: No Double vision?: No  Ears/Nose/Throat Sore throat?: No Sinus problems?: No  Hematologic/Lymphatic Swollen glands?: No Easy bruising?: No  Cardiovascular Leg swelling?: No Chest pain?: No  Respiratory Cough?: No Shortness of breath?: No  Endocrine Excessive thirst?: No  Musculoskeletal Back pain?: No Joint pain?: No  Neurological Headaches?: No Dizziness?: No  Psychologic Depression?: No Anxiety?: No  Physical Exam: BP 116/79   Pulse 66   Resp 16   Ht 5\' 9"  (1.753 m)   Wt 173 lb 3.2 oz (78.6 kg)   SpO2 98%   BMI 25.58 kg/m   Constitutional:  Alert and oriented, No acute distress. HEENT: [place] AT, moist mucus membranes.  Trachea midline, no masses. Cardiovascular: No clubbing, cyanosis, or edema. Respiratory: Normal respiratory effort, no increased work of breathing. Lymph: No  cervical or inguinal lymphadenopathy. Skin: No rashes, bruises or suspicious lesions. Neurologic: Grossly intact, no focal deficits, moving all 4 extremities. Psychiatric: Normal mood and affect.    Assessment & Plan:   Doing well status post left ureteroscopic stone removal.  He does have nonobstructing left renal calculi.  Have recommended a metabolic evaluation to consist of blood work and a 24-hour urine study.  He will be notified with results.  Follow-up 6 months with a KUB.  Abbie Sons, [place] [address] [place], [place] [address] [phone]

## 2018-03-15 IMAGING — CR DG ABDOMEN 1V
1 series · 2 of 2 positions shown · non-contrast
Comparison: CT urogram October 23, 2017 and KUB October 03, 2017.

CLINICAL DATA: Follow-up kidney stones

EXAM:
ABDOMEN - 1 VIEW

[Series 1: dg abd 1 view · 0.14mm/px · 2 of 2 slices shown]
[im 1/2]
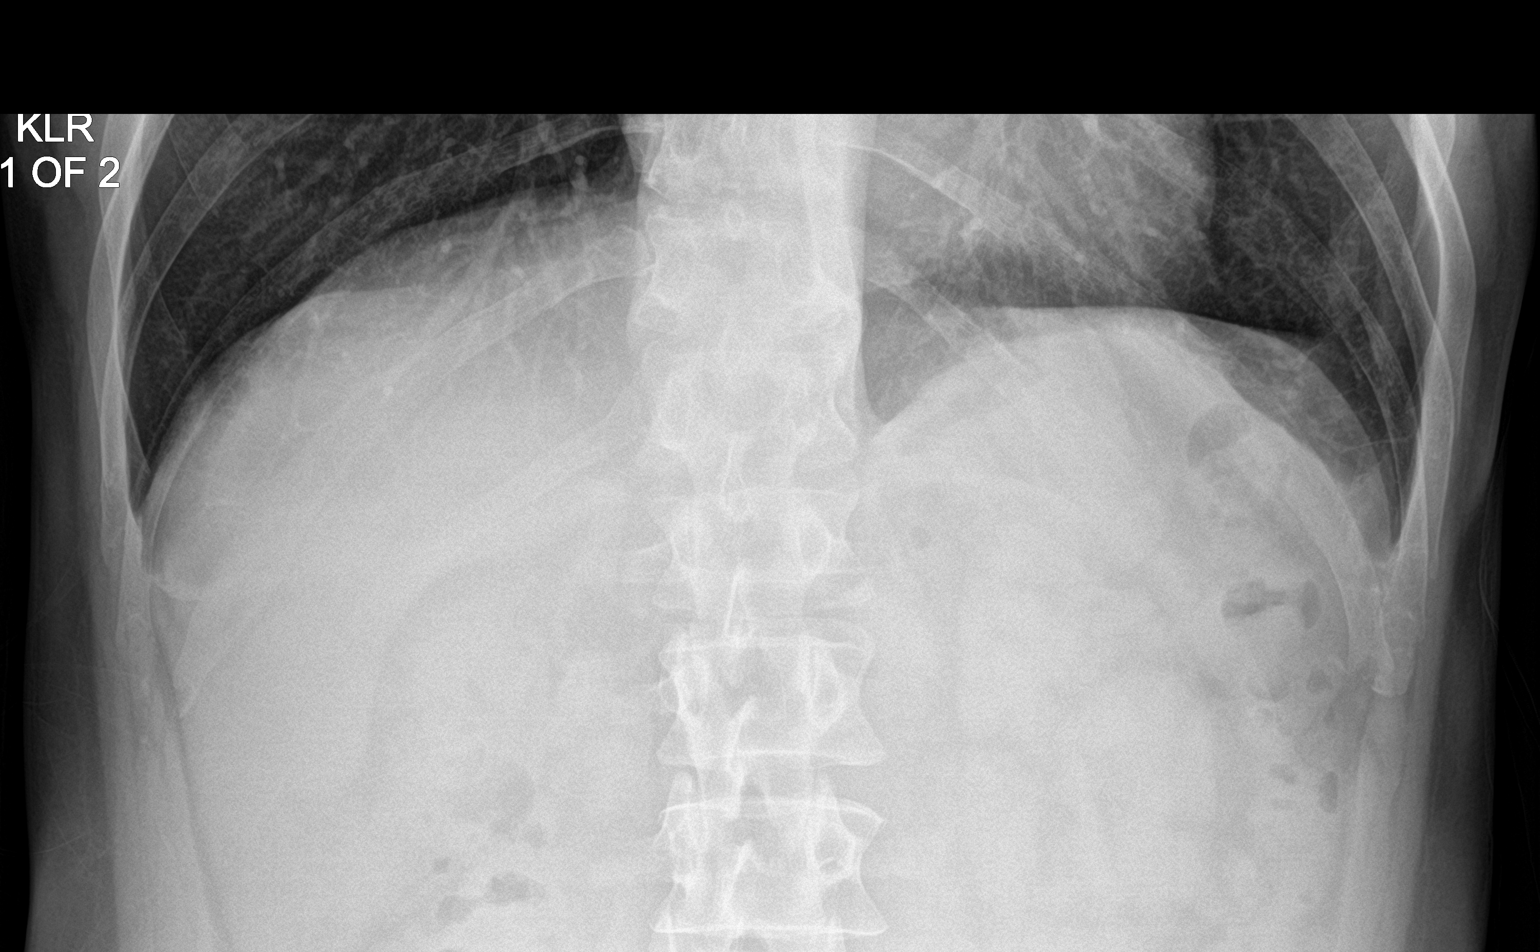
[im 2/2]
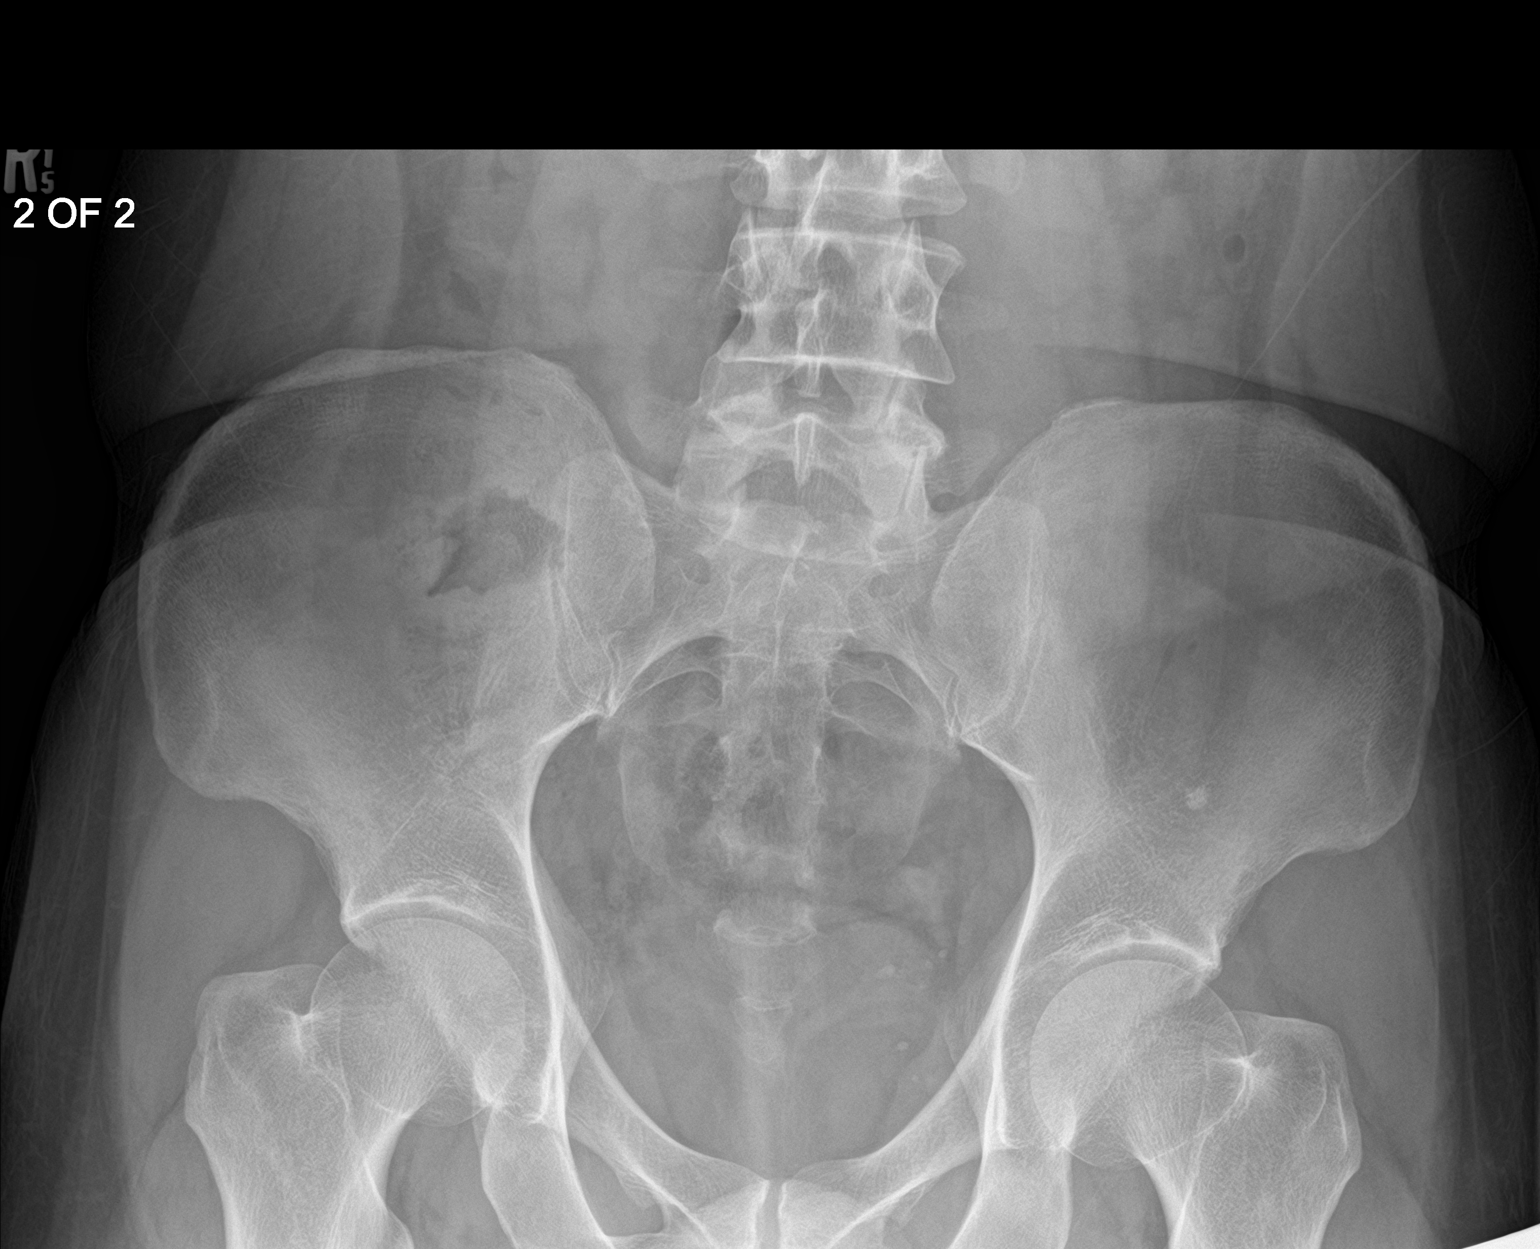

[2 of 2 positions shown; findings below may reference images not displayed]

FINDINGS: No abnormal calcifications project over either kidney. There are
calcifications in the left aspect of the true bony pelvis which
likely reflect phleboliths. A approximately 2 x 5 mm calcification
projects in the region of the left UVJ and could reflect a recurrent
or residual stone. The lung bases are clear. The bony structures are
unremarkable.
IMPRESSION: Possible left UVJ stone.  No stones observed elsewhere.

## 2018-04-07 DIAGNOSIS — D492 Neoplasm of unspecified behavior of bone, soft tissue, and skin: Secondary | ICD-10-CM | POA: Diagnosis not present

## 2018-04-24 DIAGNOSIS — D492 Neoplasm of unspecified behavior of bone, soft tissue, and skin: Secondary | ICD-10-CM | POA: Diagnosis not present

## 2018-07-31 DIAGNOSIS — D2221 Melanocytic nevi of right ear and external auricular canal: Secondary | ICD-10-CM | POA: Diagnosis not present

## 2018-07-31 DIAGNOSIS — D485 Neoplasm of uncertain behavior of skin: Secondary | ICD-10-CM | POA: Diagnosis not present

## 2018-07-31 DIAGNOSIS — L919 Hypertrophic disorder of the skin, unspecified: Secondary | ICD-10-CM | POA: Diagnosis not present

## 2018-09-15 ENCOUNTER — Ambulatory Visit: Payer: 59 | Admitting: Urology

## 2019-01-16 DIAGNOSIS — L821 Other seborrheic keratosis: Secondary | ICD-10-CM | POA: Diagnosis not present

## 2019-01-16 DIAGNOSIS — B078 Other viral warts: Secondary | ICD-10-CM | POA: Diagnosis not present

## 2019-01-16 DIAGNOSIS — L570 Actinic keratosis: Secondary | ICD-10-CM | POA: Diagnosis not present

## 2019-01-16 DIAGNOSIS — Z86018 Personal history of other benign neoplasm: Secondary | ICD-10-CM | POA: Diagnosis not present

## 2019-01-16 DIAGNOSIS — L578 Other skin changes due to chronic exposure to nonionizing radiation: Secondary | ICD-10-CM | POA: Diagnosis not present

## 2019-11-05 ENCOUNTER — Ambulatory Visit: Payer: 59 | Attending: Internal Medicine

## 2019-11-05 DIAGNOSIS — Z20822 Contact with and (suspected) exposure to covid-19: Secondary | ICD-10-CM

## 2019-11-07 LAB — NOVEL CORONAVIRUS, NAA: SARS-CoV-2, NAA: NOT DETECTED

## 2019-12-15 DIAGNOSIS — E785 Hyperlipidemia, unspecified: Secondary | ICD-10-CM | POA: Insufficient documentation

## 2019-12-15 DIAGNOSIS — N200 Calculus of kidney: Secondary | ICD-10-CM | POA: Insufficient documentation

## 2020-12-29 ENCOUNTER — Ambulatory Visit (INDEPENDENT_AMBULATORY_CARE_PROVIDER_SITE_OTHER): Payer: 59 | Admitting: Nurse Practitioner

## 2020-12-29 ENCOUNTER — Encounter (INDEPENDENT_AMBULATORY_CARE_PROVIDER_SITE_OTHER): Payer: Self-pay | Admitting: Nurse Practitioner

## 2020-12-29 ENCOUNTER — Other Ambulatory Visit: Payer: Self-pay

## 2020-12-29 VITALS — BP 112/79 | HR 77 | Resp 16 | Ht 69.5 in | Wt 174.0 lb

## 2020-12-29 DIAGNOSIS — I8311 Varicose veins of right lower extremity with inflammation: Secondary | ICD-10-CM | POA: Diagnosis not present

## 2020-12-29 DIAGNOSIS — I8312 Varicose veins of left lower extremity with inflammation: Secondary | ICD-10-CM | POA: Diagnosis not present

## 2021-01-09 ENCOUNTER — Encounter (INDEPENDENT_AMBULATORY_CARE_PROVIDER_SITE_OTHER): Payer: Self-pay | Admitting: Nurse Practitioner

## 2021-01-09 NOTE — Progress Notes (Signed)
Subjective:    Patient ID: Edwin Day, male    DOB: 20-Apr-1966, 55 y.o.   MRN: [id number] Chief Complaint  Patient presents with  . New Patient (Initial Visit)    Ref Feldpaush asymptomatic rle vv    Payne Garske is a 55 year old male referred by Dr. Ellison Hughs for  evaluation of symptomatic varicose veins. The patient relates burning and stinging which worsened steadily throughout the course of the day, particularly with standing. The patient also notes an aching and throbbing pain over the varicosities, particularly with prolonged dependent positions. The symptoms are significantly improved with elevation.  The patient also notes that during hot weather the symptoms are greatly intensified. The patient states the pain from the varicose veins interferes with work, daily exercise, shopping and household maintenance. At this point, the symptoms are persistent and severe enough that they're having a negative impact on lifestyle and are interfering with daily activities.  There is no history of DVT, PE or superficial thrombophlebitis. There is no history of ulceration or hemorrhage. The patient denies a significant family history of varicose veins.   The patient has not worn graduated compression in the past. At the present time the patient has not been using over-the-counter analgesics. There is no history of prior surgical intervention or sclerotherapy.    Review of Systems  Musculoskeletal: Positive for myalgias.  All other systems reviewed and are negative.      Objective:   Physical Exam Vitals reviewed.  HENT:     Head: Normocephalic.  Cardiovascular:     Rate and Rhythm: Normal rate.     Pulses: Normal pulses.  Pulmonary:     Effort: Pulmonary effort is normal.  Musculoskeletal:     Right lower leg: Edema present.     Comments: Large varicosities noticeable on the right lower extremity  Neurological:     Mental Status: He is alert and oriented to person,  place, and time.  Psychiatric:        Mood and Affect: Mood normal.        Behavior: Behavior normal.        Thought Content: Thought content normal.        Judgment: Judgment normal.     BP 112/79 (BP Location: Right Arm)   Pulse 77   Resp 16   Ht 5' 9.5" (1.765 m)   Wt 174 lb (78.9 kg)   BMI 25.33 kg/m   Past Medical History:  Diagnosis Date  . Healthy adult on routine physical examination   . History of kidney stones   . Kidney stone     Social History   Socioeconomic History  . Marital status: Married    Spouse name: Not on file  . Number of children: Not on file  . Years of education: Not on file  . Highest education level: Not on file  Occupational History  . Not on file  Tobacco Use  . Smoking status: Never Smoker  . Smokeless tobacco: Never Used  Vaping Use  . Vaping Use: Never used  Substance and Sexual Activity  . Alcohol use: Yes    Comment: Rarely  . Drug use: No  . Sexual activity: Not on file  Other Topics Concern  . Not on file  Social History Narrative  . Not on file   Social Determinants of Health   Financial Resource Strain: Not on file  Food Insecurity: Not on file  Transportation Needs: Not on file  Physical Activity: Not on  file  Stress: Not on file  Social Connections: Not on file  Intimate Partner Violence: Not on file    Past Surgical History:  Procedure Laterality Date  . CYSTOSCOPY/URETEROSCOPY/HOLMIUM LASER/STENT PLACEMENT Left 01/03/2018   Procedure: CYSTOSCOPY/URETEROSCOPY;  Surgeon: Abbie Sons, MD;  Location: [facility];  Service: Urology;  Laterality: Left;  . STONE EXTRACTION WITH BASKET Left 01/03/2018   Procedure: STONE EXTRACTION WITH BASKET;  Surgeon: Abbie Sons, MD;  Location: [facility] OR[facility];  Service: Urology;  Laterality: Left;  [name] VASECTOMY  03/2017   Dr. Bernardo Heater    Family History  Problem Relation Age of Onset  . Lung cancer Father   . Other Mother        Pre-diabetic  . Kidney Stones Mother   . Kidney  Stones Brother   . Skin cancer Brother        All has been removed- Not Melanoma    No Known Allergies  No flowsheet data found.    CMP  No results found for: NA, K, CL, CO2, GLUCOSE, BUN, CREATININE, CALCIUM, PROT, ALBUMIN, AST, ALT, ALKPHOS, BILITOT, GFRNONAA, GFRAA   No results found.     Assessment & Plan:   1. Varicose veins of both lower extremities with inflammation  Recommend:  The patient has large symptomatic varicose veins that are painful and associated with swelling.  I have had a long discussion with the patient regarding  varicose veins and why they cause symptoms.  Patient will begin wearing graduated compression stockings class 1 on a daily basis, beginning first thing in the morning and removing them in the evening. The patient is instructed specifically not to sleep in the stockings.    The patient  will also begin using over-the-counter analgesics such as Motrin 600 mg po TID to help control the symptoms.    In addition, behavioral modification including elevation during the day will be initiated.    Pending the results of these changes the  patient will be reevaluated in three months.   An  ultrasound of the venous system will be obtained.   Further plans will be based on the ultrasound results and whether conservative therapies are successful at eliminating the pain and swelling.    Current Outpatient Medications on File Prior to Visit  Medication Sig Dispense Refill  . acetaminophen (TYLENOL) 325 MG tablet Take 650 mg by mouth every 6 (six) hours as needed.    [name] ibuprofen (ADVIL,MOTRIN) 200 MG tablet Take 400 mg by mouth every 8 (eight) hours as needed (for pain.).    [name] Multiple Vitamin (MULTIVITAMIN WITH MINERALS) TABS tablet Take 1 tablet by mouth daily.    . Omega-3 Fatty Acids (FISH OIL PO) Take 1 capsule by mouth daily.     No current facility-administered medications on file prior to visit.    There are no Patient Instructions on file for  this visit. No follow-ups on file.   Kris Hartmann, NP

## 2021-03-29 DIAGNOSIS — I83819 Varicose veins of unspecified lower extremities with pain: Secondary | ICD-10-CM | POA: Insufficient documentation

## 2021-03-29 NOTE — Progress Notes (Signed)
MRN : [id number]  Edwin Day is a 55 y.o. ([birth year]) male who presents with chief complaint of No chief complaint on file. [name]  History of Present Illness:   The patient returns for followup evaluation 3 months after the initial visit. The patient continues to have pain in the lower extremities with dependency. The pain is lessened with elevation. Graduated compression stockings, Class I (20-30 mmHg), have been worn but the stockings do not eliminate the leg pain. Over-the-counter analgesics do not improve the symptoms. The degree of discomfort continues to interfere with daily activities. The patient notes the pain in the legs is causing problems with daily exercise, at the workplace and even with household activities and maintenance such as standing in the kitchen preparing meals and doing dishes.   Venous ultrasound shows normal deep venous system, no evidence of acute or chronic DVT.  Superficial reflux is present in the right great saphenous vein.  No outpatient medications have been marked as taking for the 03/30/21 encounter (Appointment) with Delana Meyer, Dolores Lory, MD.    Past Medical History:  Diagnosis Date  . Healthy adult on routine physical examination   . History of kidney stones   . Kidney stone     Past Surgical History:  Procedure Laterality Date  . CYSTOSCOPY/URETEROSCOPY/HOLMIUM LASER/STENT PLACEMENT Left 01/03/2018   Procedure: CYSTOSCOPY/URETEROSCOPY;  Surgeon: Abbie Sons, MD;  Location: [facility];  Service: Urology;  Laterality: Left;  . STONE EXTRACTION WITH BASKET Left 01/03/2018   Procedure: STONE EXTRACTION WITH BASKET;  Surgeon: Abbie Sons, MD;  Location: [facility];  Service: Urology;  Laterality: Left;  [name] VASECTOMY  03/2017   Dr. Bernardo Heater    Social History Social History   Tobacco Use  . Smoking status: Never Smoker  . Smokeless tobacco: Never Used  Vaping Use  . Vaping Use: Never used  Substance Use Topics  . Alcohol use: Yes     Comment: Rarely  . Drug use: No    Family History Family History  Problem Relation Age of Onset  . Lung cancer Father   . Other Mother        Pre-diabetic  . Kidney Stones Mother   . Kidney Stones Brother   . Skin cancer Brother        All has been removed- Not Melanoma    No Known Allergies   REVIEW OF SYSTEMS (Negative unless checked)  Constitutional: [] Weight loss  [] Fever  [] Chills Cardiac: [] Chest pain   [] Chest pressure   [] Palpitations   [] Shortness of breath when laying flat   [] Shortness of breath with exertion. Vascular:  [] Pain in legs with walking   [x] Pain in legs at rest  [] History of DVT   [] Phlebitis   [] Swelling in legs   [x] Varicose veins   [] Non-healing ulcers Pulmonary:   [] Uses home oxygen   [] Productive cough   [] Hemoptysis   [] Wheeze  [] COPD   [] Asthma Neurologic:  [] Dizziness   [] Seizures   [] History of stroke   [] History of TIA  [] Aphasia   [] Vissual changes   [] Weakness or numbness in arm   [] Weakness or numbness in leg Musculoskeletal:   [] Joint swelling   [] Joint pain   [] Low back pain Hematologic:  [] Easy bruising  [] Easy bleeding   [] Hypercoagulable state   [] Anemic Gastrointestinal:  [] Diarrhea   [] Vomiting  [] Gastroesophageal reflux/heartburn   [] Difficulty swallowing. Genitourinary:  [] Chronic kidney disease   [] Difficult urination  [] Frequent urination   [] Blood in urine Skin:  [] Rashes   []   Ulcers  Psychological:  [] History of anxiety   []  History of major depression.  Physical Examination  There were no vitals filed for this visit. There is no height or weight on file to calculate BMI. Gen: WD/WN, NAD Head: [place]/AT, No temporalis wasting.  Ear/Nose/Throat: Hearing grossly intact, nares w/o erythema or drainage Eyes: PER, EOMI, sclera nonicteric.  Neck: Supple, no large masses.   Pulmonary:  Good air movement, no audible wheezing bilaterally, no use of accessory muscles.  Cardiac: RRR, no JVD Vascular:Large varicosities present extensively  greater than 10 mm right leg.  Mild venous stasis changes to the legs bilaterally.  2+ soft pitting edema Vessel Right Left  Radial Palpable Palpable  Gastrointestinal: Non-distended. No guarding/no peritoneal signs.  Musculoskeletal: M/S 5/5 throughout.  No deformity or atrophy.  Neurologic: CN 2-12 intact. Symmetrical.  Speech is fluent. Motor exam as listed above. Psychiatric: Judgment intact, Mood & affect appropriate for pt's clinical situation. Dermatologic: No rashes or ulcers noted.  No changes consistent with cellulitis.   CBC No results found for: WBC, HGB, HCT, MCV, PLT  BMET No results found for: NA, K, CL, CO2, GLUCOSE, BUN, CREATININE, CALCIUM, GFRNONAA, GFRAA CrCl cannot be calculated (No successful lab value found.).  COAG No results found for: INR, PROTIME  Radiology No results found.    Assessment/Plan 1. Varicose veins with pain Recommend  I have reviewed my previous  discussion with the patient regarding  varicose veins and why they cause symptoms. Patient will continue  wearing graduated compression stockings class 1 on a daily basis, beginning first thing in the morning and removing them in the evening.    In addition, behavioral modification including elevation during the day was again discussed and this will continue.  The patient has utilized over the counter pain medications and has been exercising.  However, at this time conservative therapy has not alleviated the patient's symptoms of leg pain and swelling  Recommend: laser ablation of the right great saphenous vein to eliminate the symptoms of pain and swelling of the lower extremities caused by the severe superficial venous reflux disease.   2. Mixed hyperlipidemia Continue statin as ordered and reviewed, no changes at this time     Hortencia Pilar, MD  03/29/2021 5:46 PM

## 2021-03-30 ENCOUNTER — Other Ambulatory Visit: Payer: Self-pay

## 2021-03-30 ENCOUNTER — Ambulatory Visit (INDEPENDENT_AMBULATORY_CARE_PROVIDER_SITE_OTHER): Payer: 59

## 2021-03-30 ENCOUNTER — Ambulatory Visit (INDEPENDENT_AMBULATORY_CARE_PROVIDER_SITE_OTHER): Payer: 59 | Admitting: Vascular Surgery

## 2021-03-30 VITALS — BP 138/80 | HR 64 | Ht 70.0 in | Wt 175.0 lb

## 2021-03-30 DIAGNOSIS — I83819 Varicose veins of unspecified lower extremities with pain: Secondary | ICD-10-CM | POA: Diagnosis not present

## 2021-03-30 DIAGNOSIS — E782 Mixed hyperlipidemia: Secondary | ICD-10-CM | POA: Diagnosis not present

## 2021-03-30 DIAGNOSIS — I8312 Varicose veins of left lower extremity with inflammation: Secondary | ICD-10-CM | POA: Diagnosis not present

## 2021-03-30 DIAGNOSIS — I8311 Varicose veins of right lower extremity with inflammation: Secondary | ICD-10-CM

## 2021-04-01 ENCOUNTER — Encounter (INDEPENDENT_AMBULATORY_CARE_PROVIDER_SITE_OTHER): Payer: Self-pay | Admitting: Vascular Surgery

## 2021-10-25 ENCOUNTER — Encounter (INDEPENDENT_AMBULATORY_CARE_PROVIDER_SITE_OTHER): Payer: Self-pay

## 2021-11-24 ENCOUNTER — Ambulatory Visit (INDEPENDENT_AMBULATORY_CARE_PROVIDER_SITE_OTHER): Payer: Self-pay | Admitting: Vascular Surgery

## 2022-05-11 ENCOUNTER — Encounter (INDEPENDENT_AMBULATORY_CARE_PROVIDER_SITE_OTHER): Payer: Self-pay

## 2022-05-11 ENCOUNTER — Telehealth (INDEPENDENT_AMBULATORY_CARE_PROVIDER_SITE_OTHER): Payer: Self-pay

## 2022-05-11 NOTE — Telephone Encounter (Signed)
It appears that he is referring to the fee schedule in looking at the last myChart message.  If someone could assist I would appreciate.

## 2022-05-11 NOTE — Telephone Encounter (Signed)
Patient called wanting to speak with you again regarding the cost of the laser ablation procedure that was discussed in May. Thank you

## 2022-08-27 ENCOUNTER — Encounter (INDEPENDENT_AMBULATORY_CARE_PROVIDER_SITE_OTHER): Payer: Self-pay

## 2022-11-04 NOTE — Progress Notes (Signed)
MRN : [id number]  Edwin Day is a 57 y.o. (07/22/1966) male who presents with chief complaint of varicose veins hurt.  History of Present Illness:   The patient returns for followup evaluation more than 12 months after the initial visit. The patient continues to have pain in the lower extremities with dependency. The pain is lessened with elevation. Graduated compression stockings, Class I (20-30 mmHg), have been worn but the stockings do not eliminate the leg pain. Over-the-counter analgesics do not improve the symptoms. The degree of discomfort continues to interfere with daily activities. The patient notes the pain in the legs is causing problems with daily exercise, at the workplace and even with household activities and maintenance such as standing in the kitchen preparing meals and doing dishes.   Venous ultrasound shows normal deep venous system, no evidence of acute or chronic DVT.  Superficial reflux is present in the right great saphenous vein.   No outpatient medications have been marked as taking for the 11/05/22 encounter (Appointment) with Delana Meyer, Dolores Lory, MD.    Past Medical History:  Diagnosis Date   Healthy adult on routine physical examination    History of kidney stones    Kidney stone     Past Surgical History:  Procedure Laterality Date   CYSTOSCOPY/URETEROSCOPY/HOLMIUM LASER/STENT PLACEMENT Left 01/03/2018   Procedure: CYSTOSCOPY/URETEROSCOPY;  Surgeon: Abbie Sons, MD;  Location: [facility];  Service: Urology;  Laterality: Left;   STONE EXTRACTION WITH BASKET Left 01/03/2018   Procedure: STONE EXTRACTION WITH BASKET;  Surgeon: Abbie Sons, MD;  Location: [facility];  Service: Urology;  Laterality: Left;   VASECTOMY  03/2017   Dr. Bernardo Heater    Social History Social History   Tobacco Use   Smoking status: Never   Smokeless tobacco: Never  Vaping Use   Vaping Use: Never used  Substance Use Topics   Alcohol use: Yes    Comment: Rarely    Drug use: No    Family History Family History  Problem Relation Age of Onset   Lung cancer Father    Other Mother        Pre-diabetic   Kidney Stones Mother    Kidney Stones Brother    Skin cancer Brother        All has been removed- Not Melanoma    No Known Allergies   REVIEW OF SYSTEMS (Negative unless checked)  Constitutional: '[]'$ Weight loss  '[]'$ Fever  '[]'$ Chills Cardiac: '[]'$ Chest pain   '[]'$ Chest pressure   '[]'$ Palpitations   '[]'$ Shortness of breath when laying flat   '[]'$ Shortness of breath with exertion. Vascular:  '[]'$ Pain in legs with walking   '[x]'$ Pain in legs with standing  '[]'$ History of DVT   '[]'$ Phlebitis   '[]'$ Swelling in legs   '[x]'$ Varicose veins   '[]'$ Non-healing ulcers Pulmonary:   '[]'$ Uses home oxygen   '[]'$ Productive cough   '[]'$ Hemoptysis   '[]'$ Wheeze  '[]'$ COPD   '[]'$ Asthma Neurologic:  '[]'$ Dizziness   '[]'$ Seizures   '[]'$ History of stroke   '[]'$ History of TIA  '[]'$ Aphasia   '[]'$ Vissual changes   '[]'$ Weakness or numbness in arm   '[]'$ Weakness or numbness in leg Musculoskeletal:   '[]'$ Joint swelling   '[]'$ Joint pain   '[]'$ Low back pain Hematologic:  '[]'$ Easy bruising  '[]'$ Easy bleeding   '[]'$ Hypercoagulable state   '[]'$ Anemic Gastrointestinal:  '[]'$ Diarrhea   '[]'$ Vomiting  '[]'$ Gastroesophageal reflux/heartburn   '[]'$ Difficulty swallowing. Genitourinary:  '[]'$ Chronic kidney disease   '[]'$ Difficult urination  '[]'$ Frequent urination   '[]'$ Blood in urine Skin:  '[]'$ Rashes   '[]'$   Ulcers  Psychological:  '[]'$ History of anxiety   '[]'$  History of major depression.  Physical Examination  There were no vitals filed for this visit. There is no height or weight on file to calculate BMI. Gen: WD/WN, NAD Head: /AT, No temporalis wasting.  Ear/Nose/Throat: Hearing grossly intact, nares w/o erythema or drainage, pinna without lesions Eyes: PER, EOMI, sclera nonicteric.  Neck: Supple, no gross masses.  No JVD.  Pulmonary:  Good air movement, no audible wheezing, no use of accessory muscles.  Cardiac: RRR, precordium not hyperdynamic. Vascular:  Large  varicosities present, greater than 10 mm right leg.  Veins are tender to palpation  Mild venous stasis changes to the legs bilaterally.  Trace soft pitting edema CEAP C3sEpAsPr Vessel Right Left  Radial Palpable Palpable  Gastrointestinal: soft, non-distended. No guarding/no peritoneal signs.  Musculoskeletal: M/S 5/5 throughout.  No deformity.  Neurologic: CN 2-12 intact. Pain and light touch intact in extremities.  Symmetrical.  Speech is fluent. Motor exam as listed above. Psychiatric: Judgment intact, Mood & affect appropriate for pt's clinical situation. Dermatologic: Venous rashes no ulcers noted.  No changes consistent with cellulitis. Lymph : No lichenification or skin changes of chronic lymphedema.  CBC No results found for: "WBC", "HGB", "HCT", "MCV", "PLT"  BMET No results found for: "NA", "K", "CL", "CO2", "GLUCOSE", "BUN", "CREATININE", "CALCIUM", "GFRNONAA", "GFRAA" CrCl cannot be calculated (No successful lab value found.).  COAG No results found for: "INR", "PROTIME"  Radiology No results found.   Assessment/Plan 1. Varicose veins with pain Recommend  I have reviewed my previous  discussion with the patient regarding  varicose veins and why they cause symptoms. Patient will continue  wearing graduated compression stockings class 1 on a daily basis, beginning first thing in the morning and removing them in the evening.  The patient is CEAP C3sEpAsPr.  The patient has been wearing compression for more than 12 weeks with no or little benefit.  The patient has been exercising daily for more than 12 weeks. The patient has been elevating and taking OTC pain medications for more than 12 weeks.  None of these have have eliminated the pain related to the varicose veins and venous reflux or the discomfort regarding venous congestion.    In addition, behavioral modification including elevation during the day was again discussed and this will continue.  The patient has utilized  over the counter pain medications and has been exercising.  However, at this time conservative therapy has not alleviated the patient's symptoms of leg pain and swelling  Recommend: laser ablation of the right great saphenous veins to eliminate the symptoms of pain and swelling of the lower extremities caused by the severe superficial venous reflux disease.   2. Mixed hyperlipidemia Continue statin as ordered and reviewed, no changes at this time    Hortencia Pilar, MD  11/04/2022 12:03 PM

## 2022-11-05 ENCOUNTER — Encounter (INDEPENDENT_AMBULATORY_CARE_PROVIDER_SITE_OTHER): Payer: Self-pay | Admitting: Vascular Surgery

## 2022-11-05 ENCOUNTER — Ambulatory Visit (INDEPENDENT_AMBULATORY_CARE_PROVIDER_SITE_OTHER): Payer: 59 | Admitting: Vascular Surgery

## 2022-11-05 VITALS — BP 151/81 | HR 59 | Resp 14 | Ht 70.0 in | Wt 176.0 lb

## 2022-11-05 DIAGNOSIS — E782 Mixed hyperlipidemia: Secondary | ICD-10-CM | POA: Diagnosis not present

## 2022-11-05 DIAGNOSIS — I83819 Varicose veins of unspecified lower extremities with pain: Secondary | ICD-10-CM | POA: Diagnosis not present

## 2022-11-09 ENCOUNTER — Encounter (INDEPENDENT_AMBULATORY_CARE_PROVIDER_SITE_OTHER): Payer: Self-pay | Admitting: Vascular Surgery

## 2022-12-05 ENCOUNTER — Telehealth (INDEPENDENT_AMBULATORY_CARE_PROVIDER_SITE_OTHER): Payer: Self-pay | Admitting: Vascular Surgery

## 2022-12-05 NOTE — Telephone Encounter (Signed)
Pt LVM regarding scheduling of his laser ablation. I returned call and LVM explaining that we are still in a back order holding pattern with supplies needed to perform the laser ablation. I explained that we have been waiting since October and unfortunately, I have people on the list from Oct, Nov, Dec and January. I advised to call me back with any questions.

## 2023-03-06 ENCOUNTER — Telehealth (INDEPENDENT_AMBULATORY_CARE_PROVIDER_SITE_OTHER): Payer: Self-pay | Admitting: Vascular Surgery

## 2023-03-06 NOTE — Telephone Encounter (Signed)
LVM for pt advising of prior approval for the right leg GSV laser ablation.   right leg GSV laser auth # [id number] exp: 5.23.24 - 7.25.24   1 week post right leg GSV laser   4 week post right leg GSV laser.

## 2023-05-16 ENCOUNTER — Ambulatory Visit (INDEPENDENT_AMBULATORY_CARE_PROVIDER_SITE_OTHER): Payer: 59 | Admitting: Vascular Surgery

## 2023-05-16 VITALS — Resp 17 | Ht 69.0 in | Wt 169.6 lb

## 2023-05-16 DIAGNOSIS — I83819 Varicose veins of unspecified lower extremities with pain: Secondary | ICD-10-CM

## 2023-05-16 DIAGNOSIS — I83811 Varicose veins of right lower extremities with pain: Secondary | ICD-10-CM | POA: Diagnosis not present

## 2023-05-16 NOTE — Progress Notes (Unsigned)
    MRN : [id number]  Kycen Spalla is a 57 y.o. ([birth year]) male who presents with chief complaint of No chief complaint on file. .    The patient's right lower extremity was sterilely prepped and draped.  The ultrasound machine was used to visualize the right great saphenous vein throughout its course.  A segment at the knee was selected for access.  The saphenous vein was accessed without difficulty using ultrasound guidance with a micropuncture needle.   An 0.018  wire was placed beyond the saphenofemoral junction through the sheath and the microneedle was removed.  The 65 cm sheath was then placed over the wire and the wire and dilator were removed.  The laser fiber was placed through the sheath and its tip was placed approximately 2 cm below the saphenofemoral junction.  Tumescent anesthesia was then created with a dilute lidocaine solution.  Laser energy was then delivered with constant withdrawal of the sheath and laser fiber.  Approximately 1440 Joules of energy were delivered over a length of 37 cm.  Sterile dressings were placed.  The patient tolerated the procedure well without complications.

## 2023-05-21 ENCOUNTER — Other Ambulatory Visit (INDEPENDENT_AMBULATORY_CARE_PROVIDER_SITE_OTHER): Payer: Self-pay | Admitting: Vascular Surgery

## 2023-05-21 DIAGNOSIS — I83819 Varicose veins of unspecified lower extremities with pain: Secondary | ICD-10-CM

## 2023-05-22 ENCOUNTER — Encounter (INDEPENDENT_AMBULATORY_CARE_PROVIDER_SITE_OTHER): Payer: Self-pay | Admitting: Vascular Surgery

## 2023-05-23 ENCOUNTER — Ambulatory Visit (INDEPENDENT_AMBULATORY_CARE_PROVIDER_SITE_OTHER): Payer: 59

## 2023-05-23 DIAGNOSIS — I83811 Varicose veins of right lower extremities with pain: Secondary | ICD-10-CM | POA: Diagnosis not present

## 2023-05-23 DIAGNOSIS — I83819 Varicose veins of unspecified lower extremities with pain: Secondary | ICD-10-CM

## 2023-06-13 ENCOUNTER — Ambulatory Visit (INDEPENDENT_AMBULATORY_CARE_PROVIDER_SITE_OTHER): Payer: 59 | Admitting: Nurse Practitioner

## 2023-06-13 ENCOUNTER — Encounter (INDEPENDENT_AMBULATORY_CARE_PROVIDER_SITE_OTHER): Payer: Self-pay | Admitting: Nurse Practitioner

## 2023-06-13 VITALS — BP 114/80 | HR 68 | Resp 18 | Ht 69.0 in | Wt 175.8 lb

## 2023-06-13 DIAGNOSIS — I83819 Varicose veins of unspecified lower extremities with pain: Secondary | ICD-10-CM

## 2023-06-13 NOTE — Progress Notes (Signed)
Subjective:    Patient ID: Edwin Day, male    DOB: [birth year], 57 y.o.   MRN: [id number] No chief complaint on file.   The patient returns to the office for followup status post laser ablation of the right saphenous vein on 05/16/2023.  He notes that he still has some tenderness and a notable sensation of the feeling of his great saphenous vein which is normal postintervention.  However despite that he notes that there has been improvement in his symptoms.  He does have some small residual varicosities in his calf area but he notes that they have not become significantly painful or uncomfortable. The patient is otherwise done well and there have been no complications related to the laser procedure or interval changes in the patient's overall   Post laser ultrasound shows successful ablation of the right GSV      Review of Systems  Cardiovascular:  Negative for leg swelling.  All other systems reviewed and are negative.      Objective:   Physical Exam Vitals reviewed.  HENT:     Head: Normocephalic.  Cardiovascular:     Rate and Rhythm: Normal rate.  Pulmonary:     Effort: Pulmonary effort is normal.  Musculoskeletal:        General: No tenderness.  Skin:    General: Skin is warm and dry.  Neurological:     Mental Status: He is alert and oriented to person, place, and time.  Psychiatric:        Mood and Affect: Mood normal.        Behavior: Behavior normal.        Thought Content: Thought content normal.        Judgment: Judgment normal.     There were no vitals taken for this visit.  Past Medical History:  Diagnosis Date   Healthy adult on routine physical examination    History of kidney stones    Kidney stone     Social History   Socioeconomic History   Marital status: Married    Spouse name: Not on file   Number of children: Not on file   Years of education: Not on file   Highest education level: Not on file  Occupational History   Not on  file  Tobacco Use   Smoking status: Never   Smokeless tobacco: Never  Vaping Use   Vaping status: Never Used  Substance and Sexual Activity   Alcohol use: Yes    Comment: Rarely   Drug use: No   Sexual activity: Not on file  Other Topics Concern   Not on file  Social History Narrative   Not on file   Social Determinants of Health   Financial Resource Strain: Not on file  Food Insecurity: Not on file  Transportation Needs: Not on file  Physical Activity: Not on file  Stress: Not on file  Social Connections: Not on file  Intimate Partner Violence: Not on file    Past Surgical History:  Procedure Laterality Date   CYSTOSCOPY/URETEROSCOPY/HOLMIUM LASER/STENT PLACEMENT Left 01/03/2018   Procedure: CYSTOSCOPY/URETEROSCOPY;  Surgeon: Riki Altes, MD;  Location: [facility];  Service: Urology;  Laterality: Left;   STONE EXTRACTION WITH BASKET Left 01/03/2018   Procedure: STONE EXTRACTION WITH BASKET;  Surgeon: Riki Altes, MD;  Location: [facility];  Service: Urology;  Laterality: Left;   VASECTOMY  03/2017   Dr. Lonna Cobb    Family History  Problem Relation Age of Onset  Lung cancer Father    Other Mother        Pre-diabetic   Kidney Stones Mother    Kidney Stones Brother    Skin cancer Brother        All has been removed- Not Melanoma    No Known Allergies      No data to display            CMP  No results found for: "NA", "K", "CL", "CO2", "GLUCOSE", "BUN", "CREATININE", "CALCIUM", "PROT", "ALBUMIN", "AST", "ALT", "ALKPHOS", "BILITOT", "GFR", "EGFR", "GFRNONAA"   No results found.     Assessment & Plan:   1. Varicose veins with pain Recommend:  The patient is noting improvement post endovenous laser ablation.    At this time the patient wishes to continue conservative therapy and is not interested in more invasive treatments such as laser ablation and sclerotherapy.  The Patient will follow up PRN if the symptoms worsen.   Current Outpatient  Medications on File Prior to Visit  Medication Sig Dispense Refill   acetaminophen (TYLENOL) 325 MG tablet Take 650 mg by mouth every 6 (six) hours as needed.     ALPRAZolam (XANAX) 0.5 MG tablet SMARTSIG:1 Tablet(s) By Mouth     fluticasone (FLONASE) 50 MCG/ACT nasal spray Place into the nose.     ibuprofen (ADVIL,MOTRIN) 200 MG tablet Take 400 mg by mouth every 8 (eight) hours as needed (for pain.).     Multiple Vitamin (MULTIVITAMIN WITH MINERALS) TABS tablet Take 1 tablet by mouth daily.     Omega-3 Fatty Acids (FISH OIL PO) Take 1 capsule by mouth daily.     No current facility-administered medications on file prior to visit.    There are no Patient Instructions on file for this visit. No follow-ups on file.   Georgiana Spinner, NP

## 2024-03-17 ENCOUNTER — Encounter (INDEPENDENT_AMBULATORY_CARE_PROVIDER_SITE_OTHER): Payer: Self-pay

## 2024-06-25 ENCOUNTER — Ambulatory Visit

## 2024-06-25 DIAGNOSIS — K640 First degree hemorrhoids: Secondary | ICD-10-CM | POA: Diagnosis not present

## 2024-06-25 DIAGNOSIS — Z1211 Encounter for screening for malignant neoplasm of colon: Secondary | ICD-10-CM | POA: Diagnosis present
